# Patient Record
Sex: Male | Born: 1970
Health system: Southern US, Community
[De-identification: ages and names within clinical notes are randomized; demographics above are authoritative.]

## PROBLEM LIST (undated history)

## (undated) DIAGNOSIS — I10 Essential (primary) hypertension: Secondary | ICD-10-CM

## (undated) HISTORY — PX: EYE SURGERY: SHX253

---

## 2012-09-13 ENCOUNTER — Encounter (HOSPITAL_BASED_OUTPATIENT_CLINIC_OR_DEPARTMENT_OTHER): Payer: Self-pay

## 2012-09-13 ENCOUNTER — Emergency Department (HOSPITAL_BASED_OUTPATIENT_CLINIC_OR_DEPARTMENT_OTHER): Payer: Self-pay

## 2012-09-13 ENCOUNTER — Emergency Department (HOSPITAL_BASED_OUTPATIENT_CLINIC_OR_DEPARTMENT_OTHER)
Admission: EM | Admit: 2012-09-13 | Discharge: 2012-09-14 | Disposition: A | Discharge status: Home / Self Care | Payer: Self-pay | Attending: Emergency Medicine | Admitting: Emergency Medicine

## 2012-09-13 DIAGNOSIS — R0789 Other chest pain: Secondary | ICD-10-CM

## 2012-09-13 DIAGNOSIS — F17200 Nicotine dependence, unspecified, uncomplicated: Secondary | ICD-10-CM | POA: Insufficient documentation

## 2012-09-13 DIAGNOSIS — Z79899 Other long term (current) drug therapy: Secondary | ICD-10-CM | POA: Insufficient documentation

## 2012-09-13 DIAGNOSIS — R071 Chest pain on breathing: Secondary | ICD-10-CM | POA: Insufficient documentation

## 2012-09-13 DIAGNOSIS — N289 Disorder of kidney and ureter, unspecified: Secondary | ICD-10-CM | POA: Insufficient documentation

## 2012-09-13 DIAGNOSIS — I10 Essential (primary) hypertension: Secondary | ICD-10-CM | POA: Insufficient documentation

## 2012-09-13 LAB — BASIC METABOLIC PANEL
Calcium: 9.1 mg/dL (ref 8.4–10.5)
Creatinine, Ser: 1.4 mg/dL — ABNORMAL HIGH (ref 0.50–1.35)
GFR calc Af Amer: 71 mL/min — ABNORMAL LOW (ref >90)

## 2012-09-13 LAB — TROPONIN I: Troponin I: <0.3 ng/mL (ref <0.30)

## 2012-09-13 MED ORDER — LISINOPRIL 10 MG PO TABS
20.0000 mg | ORAL_TABLET | Freq: Once | ORAL | Status: AC
  Administered 2012-09-13: 20 mg via ORAL
  Filled 2012-09-13: qty 2

## 2012-09-13 MED ORDER — LISINOPRIL 10 MG PO TABS: 10.0000 mg | ORAL_TABLET | Freq: Once | ORAL | Status: DC

## 2012-09-13 MED ORDER — HYDROCHLOROTHIAZIDE 25 MG PO TABS: 12.5000 mg | ORAL_TABLET | Freq: Every day | ORAL | Status: DC

## 2012-09-13 MED ORDER — OXYCODONE-ACETAMINOPHEN 5-325 MG PO TABS
2.0000 | ORAL_TABLET | Freq: Once | ORAL | Status: AC
  Administered 2012-09-13: 2 via ORAL
  Filled 2012-09-13 (×2): qty 2

## 2012-09-13 MED ORDER — LISINOPRIL 20 MG PO TABS: 20.0000 mg | ORAL_TABLET | Freq: Every day | ORAL | Status: DC

## 2012-09-13 MED ORDER — HYDROCHLOROTHIAZIDE 25 MG PO TABS: 25.0000 mg | ORAL_TABLET | Freq: Every day | ORAL | Status: DC

## 2012-09-13 MED ORDER — HYDROCHLOROTHIAZIDE 25 MG PO TABS
ORAL_TABLET | ORAL | Status: AC
  Administered 2012-09-13: 25 mg
  Filled 2012-09-13: qty 1

## 2012-09-13 NOTE — ED Provider Notes (Signed)
History    This chart was scribed for Jaime Shi, MD by Leone Payor, ED Scribe. This patient was seen in room MH03/MH03 and the patient's care was started 9:01 PM.   CSN: [id number]  Arrival date & time 09/13/12  2036   First MD Initiated Contact with Patient 09/13/12 2100      Chief Complaint  Patient presents with  . Chest Pain     The history is provided by the patient. No language interpreter was used.    Jaime Brown is a 42 y.o. male who presents to the Emergency Department complaining of new, constant, unchanged left peristernal chest pain onset 3 days ago. Pt reports pain worse with inspiration and movement. He denies any recent injury or lifting anything heavy. Has family h/o of HTN but does not regularly have his BP checked. He denies SOB, nausea, vomiting.     Pt is a current everyday smoker and occasional alcohol user.  History reviewed. No pertinent past medical history.  History reviewed. No pertinent past surgical history.  No family history on file.  History  Substance Use Topics  . Smoking status: Current Every Day Smoker -- 0.50 packs/day    Types: Cigarettes  . Smokeless tobacco: Not on file  . Alcohol Use: Yes     Comment: social      Review of Systems A complete 10 system review of systems was obtained and all systems are negative except as noted in the HPI and PMH.    Allergies  Review of patient's allergies indicates no known allergies.  Home Medications   Current Outpatient Rx  Name  Route  Sig  Dispense  Refill  . hydrochlorothiazide (HYDRODIURIL) 25 MG tablet   Oral   Take 0.5 tablets (12.5 mg total) by mouth daily.   30 tablet   0   . lisinopril (PRINIVIL,ZESTRIL) 20 MG tablet   Oral   Take 1 tablet (20 mg total) by mouth daily.   30 tablet   0     BP 168/96  Pulse 66  Temp(Src) 97.9 F (36.6 C) (Oral)  Resp 16  Ht 5\' 10"  (1.778 m)  Wt 150 lb (68.04 kg)  BMI 21.52 kg/m2  SpO2 100%  Physical Exam  Nursing note  and vitals reviewed. Constitutional: He is oriented to person, place, and time. He appears well-developed and well-nourished. No distress.  HENT:  Head: Normocephalic and atraumatic.  Eyes: Pupils are equal, round, and reactive to light.  Neck: Normal range of motion.  Cardiovascular: Normal rate and intact distal pulses.   Pulmonary/Chest: No respiratory distress.  Abdominal: Normal appearance. He exhibits no distension. There is tenderness (Reproducible chest pain is noted).    Musculoskeletal: Normal range of motion.  Neurological: He is alert and oriented to person, place, and time. No cranial nerve deficit.  Skin: Skin is warm and dry. No rash noted.  Psychiatric: He has a normal mood and affect. His behavior is normal.    ED Course  Procedures (including critical care time)  Date: 09/13/2012  Rate: 65  Rhythm: normal sinus rhythm  QRS Axis: normal  Intervals: normal  ST/T Wave abnormalities: Findings consistent with left ventricular hypertrophy  Conduction Disutrbances: Early repolarization  Narrative Interpretation: Abnormal EKG     DIAGNOSTIC STUDIES: Oxygen Saturation is 100% on room air, normal by my interpretation.    COORDINATION OF CARE:  9:05 PM Discussed treatment plan which includes CXR, basic metabolic panel, troponin I with pt at bedside and pt  agreed to plan.    Results for orders placed during the hospital encounter of 09/13/12  BASIC METABOLIC PANEL      Result Value Range   Sodium 138  135 - 145 mEq/L   Potassium 3.8  3.5 - 5.1 mEq/L   Chloride 103  96 - 112 mEq/L   CO2 27  19 - 32 mEq/L   Glucose, Bld 82  70 - 99 mg/dL   BUN 13  6 - 23 mg/dL   Creatinine, Ser 6.21 (*) 0.50 - 1.35 mg/dL   Calcium 9.1  8.4 - 30.8 mg/dL   GFR calc non Af Amer 62 (*) >90 mL/min   GFR calc Af Amer 71 (*) >90 mL/min  TROPONIN I      Result Value Range   Troponin I <0.30  <0.30 ng/mL   Dg Chest 2 View  09/13/2012  *RADIOLOGY REPORT*  Clinical Data: Left chest  pain  CHEST - 2 VIEW  Comparison: None.  Findings: Lungs are clear. No pleural effusion or pneumothorax.  Cardiomediastinal silhouette is within normal limits.  Visualized osseous structures are within normal limits.  IMPRESSION: Normal chest radiographs.   Original Report Authenticated By: Charline Bills, M.D.       Labs Reviewed  BASIC METABOLIC PANEL - Abnormal; Notable for the following:    Creatinine, Ser 1.40 (*)    GFR calc non Af Amer 62 (*)    GFR calc Af Amer 71 (*)    All other components within normal limits  TROPONIN I    1. Chest wall pain   2. Hypertension   3. Renal insufficiency       MDM  I personally performed the services described in this documentation, which was scribed in my presence. The recorded information has been reviewed and considered.    Jaime Shi, MD 09/13/12 2245

## 2012-09-13 NOTE — ED Notes (Signed)
Patient here with persistent chestpain x 3 days. Reports worse with inspiration and movement. Denies any associated symptoms

## 2017-06-01 ENCOUNTER — Emergency Department (HOSPITAL_BASED_OUTPATIENT_CLINIC_OR_DEPARTMENT_OTHER): Payer: Self-pay

## 2017-06-01 ENCOUNTER — Emergency Department (HOSPITAL_BASED_OUTPATIENT_CLINIC_OR_DEPARTMENT_OTHER)
Admission: EM | Admit: 2017-06-01 | Discharge: 2017-06-01 | Disposition: A | Discharge status: Home / Self Care | Payer: Self-pay | Attending: Emergency Medicine | Admitting: Emergency Medicine

## 2017-06-01 ENCOUNTER — Encounter (HOSPITAL_BASED_OUTPATIENT_CLINIC_OR_DEPARTMENT_OTHER): Payer: Self-pay | Admitting: *Deleted

## 2017-06-01 DIAGNOSIS — Z79899 Other long term (current) drug therapy: Secondary | ICD-10-CM | POA: Insufficient documentation

## 2017-06-01 DIAGNOSIS — F17210 Nicotine dependence, cigarettes, uncomplicated: Secondary | ICD-10-CM | POA: Insufficient documentation

## 2017-06-01 DIAGNOSIS — R509 Fever, unspecified: Secondary | ICD-10-CM

## 2017-06-01 DIAGNOSIS — Z720 Tobacco use: Secondary | ICD-10-CM

## 2017-06-01 DIAGNOSIS — J209 Acute bronchitis, unspecified: Secondary | ICD-10-CM | POA: Insufficient documentation

## 2017-06-01 DIAGNOSIS — I10 Essential (primary) hypertension: Secondary | ICD-10-CM | POA: Insufficient documentation

## 2017-06-01 HISTORY — DX: Essential (primary) hypertension: I10

## 2017-06-01 MED ORDER — PREDNISONE 10 MG (21) PO TBPK
ORAL_TABLET | ORAL | 0 refills | Status: DC
Start: 2017-06-01 — End: 2018-05-22

## 2017-06-01 MED ORDER — IBUPROFEN 600 MG PO TABS: 600.0000 mg | ORAL_TABLET | Freq: Four times a day (QID) | ORAL | 0 refills | Status: DC | PRN

## 2017-06-01 MED ORDER — ALBUTEROL SULFATE HFA 108 (90 BASE) MCG/ACT IN AERS
1.0000 | INHALATION_SPRAY | RESPIRATORY_TRACT | Status: DC | PRN
  Administered 2017-06-01: 2 via RESPIRATORY_TRACT
  Filled 2017-06-01: qty 6.7

## 2017-06-01 MED ORDER — IPRATROPIUM-ALBUTEROL 0.5-2.5 (3) MG/3ML IN SOLN
3.0000 mL | Freq: Four times a day (QID) | RESPIRATORY_TRACT | Status: DC
  Administered 2017-06-01: 3 mL via RESPIRATORY_TRACT
  Filled 2017-06-01: qty 3

## 2017-06-01 MED ORDER — AZITHROMYCIN 250 MG PO TABS
500.0000 mg | ORAL_TABLET | Freq: Once | ORAL | Status: AC
Start: 2017-06-01 — End: 2017-06-01
  Administered 2017-06-01: 500 mg via ORAL
  Filled 2017-06-01: qty 2

## 2017-06-01 MED ORDER — KETOROLAC TROMETHAMINE 30 MG/ML IJ SOLN
30.0000 mg | Freq: Once | INTRAMUSCULAR | Status: AC
  Administered 2017-06-01: 30 mg via INTRAMUSCULAR
  Filled 2017-06-01: qty 1

## 2017-06-01 MED ORDER — AZITHROMYCIN 250 MG PO TABS: 250.0000 mg | ORAL_TABLET | Freq: Every day | ORAL | 0 refills | Status: DC

## 2017-06-01 MED ORDER — ACETAMINOPHEN 500 MG PO TABS
1000.0000 mg | ORAL_TABLET | Freq: Once | ORAL | Status: AC
  Administered 2017-06-01: 1000 mg via ORAL
  Filled 2017-06-01: qty 2

## 2017-06-01 MED ORDER — DEXAMETHASONE SODIUM PHOSPHATE 10 MG/ML IJ SOLN
10.0000 mg | Freq: Once | INTRAMUSCULAR | Status: AC
  Administered 2017-06-01: 10 mg via INTRAMUSCULAR
  Filled 2017-06-01: qty 1

## 2017-06-01 MED ORDER — AEROCHAMBER PLUS FLO-VU MEDIUM MISC
1.0000 | Freq: Once | Status: AC
  Administered 2017-06-01: 1
  Filled 2017-06-01: qty 1

## 2017-06-01 NOTE — ED Notes (Signed)
Alert, NAD, calm, interactive, resps e/u, speaking in clear short phrases, no dyspnea noted, skin W&D, VSS, EDP into room.

## 2017-06-01 NOTE — ED Provider Notes (Signed)
[facility] EMERGENCY DEPARTMENT Provider Note   CSN: [id number] Arrival date & time: 06/01/17  1914     History   Chief Complaint Chief Complaint  Patient presents with  . Cough    HPI Jaime Brown is a 46 y.o. male.  Pt presents to the ED today with cough for the past 1.5 weeks.  He said that he has been taking nyquil for sx.  The pt does smoke.  The pt denies any sick contacts.  No associated sx.        Past Medical History:  Diagnosis Date  . Hypertension     There are no active problems to display for this patient.   History reviewed. No pertinent surgical history.     Home Medications    Prior to Admission medications   Medication Sig Start Date End Date Taking? Authorizing Provider  azithromycin (ZITHROMAX) 250 MG tablet Take 1 tablet (250 mg total) by mouth daily. Take first 2 tablets together, then 1 every day until finished. 06/02/17   Jacalyn Lefevre, MD  hydrochlorothiazide (HYDRODIURIL) 25 MG tablet Take 0.5 tablets (12.5 mg total) by mouth daily. 09/14/12   Nelva Nay, MD  ibuprofen (ADVIL,MOTRIN) 600 MG tablet Take 1 tablet (600 mg total) by mouth every 6 (six) hours as needed. 06/01/17   Jacalyn Lefevre, MD  lisinopril (PRINIVIL,ZESTRIL) 20 MG tablet Take 1 tablet (20 mg total) by mouth daily. 09/13/12   Nelva Nay, MD  predniSONE (STERAPRED UNI-PAK 21 TAB) 10 MG (21) TBPK tablet Take 6 tabs by mouth daily  for 2 days, then 5 tabs for 2 days, then 4 tabs for 2 days, then 3 tabs for 2 days, 2 tabs for 2 days, then 1 tab by mouth daily for 2 days 06/01/17   Jacalyn Lefevre, MD    Family History History reviewed. No pertinent family history.  Social History Social History  Substance Use Topics  . Smoking status: Current Every Day Smoker    Packs/day: 0.50    Types: Cigarettes  . Smokeless tobacco: Never Used  . Alcohol use Yes     Comment: social     Allergies   Patient has no known allergies.   Review of  Systems Review of Systems  Constitutional: Positive for fever.  Respiratory: Positive for cough.   All other systems reviewed and are negative.    Physical Exam Updated Vital Signs BP (!) 157/99 (BP Location: Right Arm)   Pulse (!) 109   Temp (!) 100.6 F (38.1 C) (Oral)   Resp 20   Ht 5\' 10"  (1.778 m)   Wt 70.3 kg (155 lb)   SpO2 100%   BMI 22.24 kg/m   Physical Exam  Constitutional: He is oriented to person, place, and time. He appears well-developed and well-nourished.  HENT:  Head: Normocephalic and atraumatic.  Right Ear: External ear normal.  Left Ear: External ear normal.  Nose: Nose normal.  Mouth/Throat: Oropharynx is clear and moist.  Eyes: Pupils are equal, round, and reactive to light. Conjunctivae and EOM are normal.  Neck: Normal range of motion. Neck supple.  Cardiovascular: Regular rhythm, normal heart sounds and intact distal pulses.  Tachycardia present.   Pulmonary/Chest: Effort normal. He has wheezes.  Abdominal: Soft. Bowel sounds are normal.  Musculoskeletal: Normal range of motion.  Neurological: He is alert and oriented to person, place, and time.  Skin: Skin is warm. Capillary refill takes less than 2 seconds.  Psychiatric: He has a normal mood and affect.  His behavior is normal. Judgment and thought content normal.  Nursing note and vitals reviewed.    ED Treatments / Results  Labs (all labs ordered are listed, but only abnormal results are displayed) Labs Reviewed - No data to display  EKG  EKG Interpretation None       Radiology Dg Chest 2 View  Result Date: 06/01/2017 CLINICAL DATA:  Here for cough, (fever noted on arrival), onset 1.5 weeks ago, also reports weak, back AND ribs sore, HA, and bilateral lower legs ache. Rates pain 7/10. Cough is productive, yellow/green mucus. Smoker and welder. EXAM: CHEST  2 VIEW COMPARISON:  11/06/2015 FINDINGS: The heart size and mediastinal contours are within normal limits. Both lungs are  clear. The visualized skeletal structures are unremarkable. IMPRESSION: No active cardiopulmonary disease. Electronically Signed   By: Norva PavlovElizabeth  Brown M.D.   On: 06/01/2017 07:43    Procedures Procedures (including critical care time)  Medications Ordered in ED Medications  ipratropium-albuterol (DUONEB) 0.5-2.5 (3) MG/3ML nebulizer solution 3 mL (3 mLs Nebulization Given 06/01/17 0723)  albuterol (PROVENTIL HFA;VENTOLIN HFA) 108 (90 Base) MCG/ACT inhaler 1-2 puff (2 puffs Inhalation Given 06/01/17 0723)  azithromycin (ZITHROMAX) tablet 500 mg (not administered)  dexamethasone (DECADRON) injection 10 mg (10 mg Intramuscular Given 06/01/17 0736)  ketorolac (TORADOL) 30 MG/ML injection 30 mg (30 mg Intramuscular Given 06/01/17 0735)  acetaminophen (TYLENOL) tablet 1,000 mg (1,000 mg Oral Given 06/01/17 0735)  AEROCHAMBER PLUS FLO-VU MEDIUM MISC 1 each (1 each Other Given 06/01/17 0724)     Initial Impression / Assessment and Plan / ED Course  I have reviewed the triage vital signs and the nursing notes.  Pertinent labs & imaging results that were available during my care of the patient were reviewed by me and considered in my medical decision making (see chart for details).     Sx have been going on for 1.5 weeks, so I will treat him for bronchitis with zithromax.  He was given an inhaler and spacer prior to d/c.  Pt encouraged to stop smoking.  The pt told to return if worse.  Final Clinical Impressions(s) / ED Diagnoses   Final diagnoses:  Acute bronchitis, unspecified organism  Tobacco abuse  Fever, unspecified fever cause    New Prescriptions New Prescriptions   AZITHROMYCIN (ZITHROMAX) 250 MG TABLET    Take 1 tablet (250 mg total) by mouth daily. Take first 2 tablets together, then 1 every day until finished.   IBUPROFEN (ADVIL,MOTRIN) 600 MG TABLET    Take 1 tablet (600 mg total) by mouth every 6 (six) hours as needed.   PREDNISONE (STERAPRED UNI-PAK 21 TAB) 10 MG (21)  TBPK TABLET    Take 6 tabs by mouth daily  for 2 days, then 5 tabs for 2 days, then 4 tabs for 2 days, then 3 tabs for 2 days, 2 tabs for 2 days, then 1 tab by mouth daily for 2 days     Jacalyn LefevreHaviland, Chontel Warning, MD 06/01/17 [phone]

## 2017-06-01 NOTE — Discharge Instructions (Signed)
Try to stop smoking. °

## 2017-06-01 NOTE — ED Notes (Signed)
[facility]

## 2017-06-01 NOTE — ED Triage Notes (Addendum)
Here for cough, (fever noted on arrival), onset 1.5 weeks ago, also reports weak, back & ribs sore, HA, and bilateral lower legs ache. Rates pain 7/10. Cough is productive, yellow/green mucus. Smoker and welder. (Denies: fever, bleeding, facial pain, earache, sore throat, dizziness, or NVD). No meds PTA. Out of his BP meds.

## 2017-07-22 ENCOUNTER — Emergency Department (HOSPITAL_BASED_OUTPATIENT_CLINIC_OR_DEPARTMENT_OTHER)
Admission: EM | Admit: 2017-07-22 | Discharge: 2017-07-22 | Disposition: A | Discharge status: Home / Self Care | Payer: Self-pay | Attending: Emergency Medicine | Admitting: Emergency Medicine

## 2017-07-22 ENCOUNTER — Encounter (HOSPITAL_BASED_OUTPATIENT_CLINIC_OR_DEPARTMENT_OTHER): Payer: Self-pay | Admitting: Emergency Medicine

## 2017-07-22 ENCOUNTER — Emergency Department (HOSPITAL_BASED_OUTPATIENT_CLINIC_OR_DEPARTMENT_OTHER): Payer: Self-pay

## 2017-07-22 ENCOUNTER — Other Ambulatory Visit: Payer: Self-pay

## 2017-07-22 DIAGNOSIS — Z79899 Other long term (current) drug therapy: Secondary | ICD-10-CM | POA: Insufficient documentation

## 2017-07-22 DIAGNOSIS — R030 Elevated blood-pressure reading, without diagnosis of hypertension: Secondary | ICD-10-CM

## 2017-07-22 DIAGNOSIS — R51 Headache: Secondary | ICD-10-CM | POA: Insufficient documentation

## 2017-07-22 DIAGNOSIS — R519 Headache, unspecified: Secondary | ICD-10-CM

## 2017-07-22 DIAGNOSIS — L739 Follicular disorder, unspecified: Secondary | ICD-10-CM | POA: Insufficient documentation

## 2017-07-22 DIAGNOSIS — I10 Essential (primary) hypertension: Secondary | ICD-10-CM | POA: Insufficient documentation

## 2017-07-22 DIAGNOSIS — F17210 Nicotine dependence, cigarettes, uncomplicated: Secondary | ICD-10-CM | POA: Insufficient documentation

## 2017-07-22 LAB — COMPREHENSIVE METABOLIC PANEL
ALT: 9 U/L — ABNORMAL LOW (ref 17–63)
ANION GAP: 5 (ref 5–15)
AST: 14 U/L — ABNORMAL LOW (ref 15–41)
Albumin: 3.7 g/dL (ref 3.5–5.0)
Alkaline Phosphatase: 51 U/L (ref 38–126)
BILIRUBIN TOTAL: 0.8 mg/dL (ref 0.3–1.2)
BUN: 13 mg/dL (ref 6–20)
CHLORIDE: 105 mmol/L (ref 101–111)
CO2: 28 mmol/L (ref 22–32)
Calcium: 9 mg/dL (ref 8.9–10.3)
Creatinine, Ser: 1.29 mg/dL — ABNORMAL HIGH (ref 0.61–1.24)
GFR calc Af Amer: >60 mL/min (ref >60)
GFR calc non Af Amer: >60 mL/min (ref >60)
GLUCOSE: 101 mg/dL — AB (ref 65–99)
POTASSIUM: 3.8 mmol/L (ref 3.5–5.1)
Sodium: 138 mmol/L (ref 135–145)
TOTAL PROTEIN: 6.8 g/dL (ref 6.5–8.1)

## 2017-07-22 LAB — CBC WITH DIFFERENTIAL/PLATELET
BASOS PCT: 1 %
Basophils Absolute: 0 10*3/uL (ref 0.0–0.1)
EOS ABS: 0.1 10*3/uL (ref 0.0–0.7)
EOS PCT: 2 %
HEMATOCRIT: 37 % — AB (ref 39.0–52.0)
Hemoglobin: 12.6 g/dL — ABNORMAL LOW (ref 13.0–17.0)
Lymphocytes Relative: 41 %
Lymphs Abs: 2.4 10*3/uL (ref 0.7–4.0)
MCH: 28.4 pg (ref 26.0–34.0)
MCHC: 34.1 g/dL (ref 30.0–36.0)
MCV: 83.5 fL (ref 78.0–100.0)
MONO ABS: 0.5 10*3/uL (ref 0.1–1.0)
MONOS PCT: 9 %
NEUTROS ABS: 2.8 10*3/uL (ref 1.7–7.7)
Neutrophils Relative %: 47 %
PLATELETS: 235 10*3/uL (ref 150–400)
RBC: 4.43 MIL/uL (ref 4.22–5.81)
RDW: 14.9 % (ref 11.5–15.5)
WBC: 5.9 10*3/uL (ref 4.0–10.5)

## 2017-07-22 MED ORDER — PROCHLORPERAZINE EDISYLATE 5 MG/ML IJ SOLN
10.0000 mg | Freq: Once | INTRAMUSCULAR | Status: AC
  Administered 2017-07-22: 10 mg via INTRAVENOUS
  Filled 2017-07-22: qty 2

## 2017-07-22 MED ORDER — HYDROCHLOROTHIAZIDE 12.5 MG PO TABS: 25.0000 mg | ORAL_TABLET | Freq: Every day | ORAL | 0 refills | Status: DC

## 2017-07-22 MED ORDER — DIPHENHYDRAMINE HCL 50 MG/ML IJ SOLN
25.0000 mg | Freq: Once | INTRAMUSCULAR | Status: AC
  Administered 2017-07-22: 25 mg via INTRAVENOUS
  Filled 2017-07-22: qty 1

## 2017-07-22 NOTE — ED Triage Notes (Addendum)
Patient reports "painful headaches" upon waking up in the morning for the past week.  States he takes advil for relief. Patient states he is not taking blood pressure medication due to "not having any".  States he last took medication in July 2017 for HTN.  Additionally reports abscess to right axilla.

## 2017-07-22 NOTE — ED Provider Notes (Signed)
[facility] EMERGENCY DEPARTMENT Provider Note   CSN: [id number] Arrival date & time: 07/22/17  4098     History   Chief Complaint Chief Complaint  Patient presents with  . Headache  . Abscess    HPI Jaime Brown is a 46 y.o. male.  The history is provided by the patient and medical records. No language interpreter was used.  Headache   This is a recurrent problem. The current episode started more than 2 days ago. The problem occurs every few hours. The problem has been gradually improving. The headache is associated with nothing. The pain is located in the occipital region. The quality of the pain is described as sharp and dull. The pain is moderate. The pain does not radiate. Pertinent negatives include no fever, no chest pressure, no palpitations, no syncope, no shortness of breath, no nausea and no vomiting. Treatments tried: ibuprofen. The treatment provided mild relief.    Past Medical History:  Diagnosis Date  . Hypertension     There are no active problems to display for this patient.   History reviewed. No pertinent surgical history.     Home Medications    Prior to Admission medications   Medication Sig Start Date End Date Taking? Authorizing Provider  azithromycin (ZITHROMAX) 250 MG tablet Take 1 tablet (250 mg total) by mouth daily. Take first 2 tablets together, then 1 every day until finished. 06/02/17   Jacalyn Lefevre, MD  hydrochlorothiazide (HYDRODIURIL) 25 MG tablet Take 0.5 tablets (12.5 mg total) by mouth daily. 09/14/12   Nelva Nay, MD  ibuprofen (ADVIL,MOTRIN) 600 MG tablet Take 1 tablet (600 mg total) by mouth every 6 (six) hours as needed. 06/01/17   Jacalyn Lefevre, MD  lisinopril (PRINIVIL,ZESTRIL) 20 MG tablet Take 1 tablet (20 mg total) by mouth daily. 09/13/12   Nelva Nay, MD  predniSONE (STERAPRED UNI-PAK 21 TAB) 10 MG (21) TBPK tablet Take 6 tabs by mouth daily  for 2 days, then 5 tabs for 2 days, then 4 tabs for 2 days,  then 3 tabs for 2 days, 2 tabs for 2 days, then 1 tab by mouth daily for 2 days 06/01/17   Jacalyn Lefevre, MD    Family History History reviewed. No pertinent family history.  Social History Social History   Tobacco Use  . Smoking status: Current Every Day Smoker    Packs/day: 0.50    Types: Cigarettes  . Smokeless tobacco: Never Used  Substance Use Topics  . Alcohol use: Yes    Comment: social  . Drug use: No     Allergies   Patient has no known allergies.   Review of Systems Review of Systems  Constitutional: Negative for chills, diaphoresis, fatigue and fever.  HENT: Negative for congestion.   Eyes: Negative for photophobia and visual disturbance.  Respiratory: Negative for cough, chest tightness, shortness of breath, wheezing and stridor.   Cardiovascular: Negative for palpitations and syncope.  Gastrointestinal: Negative for nausea and vomiting.  Genitourinary: Negative for flank pain.  Musculoskeletal: Negative for back pain, neck pain and neck stiffness.  Skin: Negative for rash and wound.  Neurological: Positive for headaches. Negative for tremors, seizures, speech difficulty, weakness and light-headedness.  Psychiatric/Behavioral: Negative for agitation.  All other systems reviewed and are negative.    Physical Exam Updated Vital Signs BP (!) 184/105 (BP Location: Right Arm)   Pulse 71   Temp (!) 97.5 F (36.4 C) (Oral)   Resp 16   Ht 5\' 10"  (1.778  m)   Wt 70.3 kg (155 lb)   SpO2 99%   BMI 22.24 kg/m   Physical Exam  Constitutional: He is oriented to person, place, and time. He appears well-developed and well-nourished. No distress.  HENT:  Head: Normocephalic.  Nose: Nose normal.  Mouth/Throat: Oropharynx is clear and moist. No oropharyngeal exudate.  Eyes: Conjunctivae and EOM are normal. Pupils are equal, round, and reactive to light.  Neck: Normal range of motion. Neck supple.  Cardiovascular: Normal rate and intact distal pulses.  No  murmur heard. Pulmonary/Chest: Effort normal and breath sounds normal. No stridor. No respiratory distress. He has no wheezes. He exhibits no tenderness.  Abdominal: Soft. He exhibits no distension. There is no tenderness.  Musculoskeletal: He exhibits tenderness. He exhibits no edema or deformity.       Right shoulder: He exhibits tenderness.       Arms: Lymphadenopathy:    He has no cervical adenopathy.  Neurological: He is alert and oriented to person, place, and time. No cranial nerve deficit or sensory deficit. He exhibits normal muscle tone. Coordination normal.  Skin: Skin is warm. Capillary refill takes less than 2 seconds. He is not diaphoretic. No erythema. No pallor.  Psychiatric: He has a normal mood and affect.  Nursing note and vitals reviewed.    ED Treatments / Results  Labs (all labs ordered are listed, but only abnormal results are displayed) Labs Reviewed  CBC WITH DIFFERENTIAL/PLATELET - Abnormal; Notable for the following components:      Result Value   Hemoglobin 12.6 (*)    HCT 37.0 (*)    All other components within normal limits  COMPREHENSIVE METABOLIC PANEL - Abnormal; Notable for the following components:   Glucose, Bld 101 (*)    Creatinine, Ser 1.29 (*)    AST 14 (*)    ALT 9 (*)    All other components within normal limits    EKG  EKG Interpretation None       Radiology Ct Head Wo Contrast  Result Date: 07/22/2017 CLINICAL DATA:  Occipital headache for 1 week.  Hypertension. EXAM: CT HEAD WITHOUT CONTRAST TECHNIQUE: Contiguous axial images were obtained from the base of the skull through the vertex without intravenous contrast. COMPARISON:  None. FINDINGS: Brain: The brainstem, cerebellum, cerebral peduncles, thalami, basal ganglia, basilar cisterns, and ventricular system appear within normal limits. There are several areas where the tentorium appears slightly thickened. I reviewed this appearance with Dr. Tiburcio PeaJonathan Watts, and we both concur  that this likely falls within normal variation and is not considered worrisome. No intracranial hemorrhage, mass lesion, or acute CVA. Vascular: Unremarkable. The dural venous sinuses are felt to be within normal limits. Skull: Unremarkable Sinuses/Orbits: Unremarkable Other: No supplemental non-categorized findings. IMPRESSION: 1. No significant intracranial abnormality is observed. Electronically Signed   By: Gaylyn RongWalter  Liebkemann M.D.   On: 07/22/2017 08:34    Procedures Procedures (including critical care time)  Medications Ordered in ED Medications  prochlorperazine (COMPAZINE) injection 10 mg (10 mg Intravenous Given 07/22/17 0831)  diphenhydrAMINE (BENADRYL) injection 25 mg (25 mg Intravenous Given 07/22/17 0830)     Initial Impression / Assessment and Plan / ED Course  I have reviewed the triage vital signs and the nursing notes.  Pertinent labs & imaging results that were available during my care of the patient were reviewed by me and considered in my medical decision making (see chart for details).     Linwood DibblesOrlando Chaloux is a 46 y.o. male with a  past medical history significant for hypertension currently off of medications who presents for headache and concern for skin infection.  Patient reports that he has had headaches for the last week.  He reports that being in the mornings primarily.  He denies any history intracranial abnormality.  He says that he has not been on his blood pressure medicine for over one year.  He says that he did not follow-up with his PCP and never got his blood pressure prescription refilled.  He says that for the last week he has been having headaches.  They are sharp and aching.  He describes it as a 5 out of 10 currently.  No associated nausea, vomiting, or vision changes.  He says that he has had headaches in the past with elevated blood pressure and says this feels similar.  He denies recent head trauma or neck trauma.  He reports no numbness, tingling, or  weakness of extremities.  No gait difficulties by report.  He denies any chest pain, palpitations, shortness of breath, abdominal pain, urinary symptoms, or GI symptoms.  He says that he has developed a pain in his right armpit that feels like a small bump.  It is mild and pain.  No drainage or surrounding redness.  On exam, patient's right axilla has a 2 mm palpable nodule that feels like a dense follicle.  There is some tenderness but no overlying or crepitance.  No induration.  It is nonmobile.  No other nodules or bumps were palpated.  Suspect bump is a small follicle that is irritated versus abscess.  Given the small size, patient would rather wait and see if it got better on its own for the next few days.  Patient was offered incision and drainage or aspiration but patient deferred given the mild symptoms.  Patient would rather focus on his headache.  Patient's exam otherwise was unremarkable in regards to neurologic exam.  Pupil exam unremarkable.  Lungs clear and chest nontender.  Abdomen nontender.  Due to the patient's blood pressure on arrival in the 180s and his headaches, patient will have CT of the head to look for a normality.  Given the morning headaches, malignancy is considered and will look for large malignancy on CT head.  Regardless, patient will likely to follow-up with PCP for reassessment and possible future MRI if his symptoms persist.  Patient will be given headache cocktail and have his blood pressure monitored.  We will likely give patient home blood pressure medicine.  We will get screening blood work to look for kidney or I dysfunction which may contribute to symptoms.  Anticipate reassessment after imaging lab testing and medication administration.  12:25 PM Patient reports headache is improved after medications.  Laboratory testing is overall reassuring.  Slight anemia and improved creatinine from prior.  CT head showed no significant intracranial abnormalities.   Next  Blood pressure improved while in the emergency department.  Patient will be restarted on his home blood pressure medicine and instructed to follow-up with a PCP for further management.  Patient understood return precautions for new or worsening symptoms.  Doubt intracranial hemorrhage or meningitis.  Patient had no other questions or concerns and was discharged in good condition.   Final Clinical Impressions(s) / ED Diagnoses   Final diagnoses:  Nonintractable headache, unspecified chronicity pattern, unspecified headache type  Elevated blood pressure reading  Folliculitis    ED Discharge Orders        Ordered    hydrochlorothiazide (HYDRODIURIL) 12.5 MG  tablet  Daily     07/22/17 1228      Clinical Impression: 1. Nonintractable headache, unspecified chronicity pattern, unspecified headache type   2. Elevated blood pressure reading   3. Folliculitis     Disposition: Discharge  Condition: Good  I have discussed the results, Dx and Tx plan with the pt(& family if present). He/she/they expressed understanding and agree(s) with the plan. Discharge instructions discussed at great length. Strict return precautions discussed and pt &/or family have verbalized understanding of the instructions. No further questions at time of discharge.    This SmartLink is deprecated. Use AVSMEDLIST instead to display the medication list for a patient.  [facility] Up: [facility] [address] [place] [id number] [phone] Schedule an appointment as soon as possible for a visit    [facility] [address] [id number] [address]c [address] [phone]  If symptoms worsen     Tegeler, Canary Brimhristopher J, MD 07/22/17 1743

## 2017-07-22 NOTE — Discharge Instructions (Signed)
Your workup today showed no evidence of intracranial abnormality causing her headaches.  We suspect was related to her elevated blood pressure.  Your blood pressure improved in the emergency department.  Please start taking your home blood pressure medicine again and follow-up with a primary care physician for further management.  We suspect you have a small folliculitis in your armpit, please watch this and follow-up with your PCP.  We did not feel it was large enough to require incision and drainage today but if it continues to worsen, please return.

## 2017-09-30 ENCOUNTER — Encounter (HOSPITAL_COMMUNITY): Payer: Self-pay

## 2017-09-30 ENCOUNTER — Ambulatory Visit (HOSPITAL_COMMUNITY)
Admission: EM | Admit: 2017-09-30 | Discharge: 2017-09-30 | Disposition: A | Discharge status: Home / Self Care | Payer: Self-pay | Attending: Family Medicine | Admitting: Family Medicine

## 2017-09-30 DIAGNOSIS — I10 Essential (primary) hypertension: Secondary | ICD-10-CM

## 2017-09-30 DIAGNOSIS — M549 Dorsalgia, unspecified: Secondary | ICD-10-CM

## 2017-09-30 DIAGNOSIS — R7989 Other specified abnormal findings of blood chemistry: Secondary | ICD-10-CM

## 2017-09-30 LAB — POCT URINALYSIS DIP (DEVICE)
BILIRUBIN URINE: NEGATIVE
Glucose, UA: NEGATIVE mg/dL
HGB URINE DIPSTICK: NEGATIVE
LEUKOCYTES UA: NEGATIVE
NITRITE: NEGATIVE
Protein, ur: NEGATIVE mg/dL
SPECIFIC GRAVITY, URINE: 1.025 (ref 1.005–1.030)
Urobilinogen, UA: 1 mg/dL (ref 0.0–1.0)
pH: 5.5 (ref 5.0–8.0)

## 2017-09-30 LAB — POCT I-STAT, CHEM 8
BUN: 15 mg/dL (ref 6–20)
CHLORIDE: 106 mmol/L (ref 101–111)
Calcium, Ion: 1.18 mmol/L (ref 1.15–1.40)
Creatinine, Ser: 1.5 mg/dL — ABNORMAL HIGH (ref 0.61–1.24)
GLUCOSE: 90 mg/dL (ref 65–99)
HCT: 44 % (ref 39.0–52.0)
Hemoglobin: 15 g/dL (ref 13.0–17.0)
Potassium: 4.2 mmol/L (ref 3.5–5.1)
SODIUM: 141 mmol/L (ref 135–145)
TCO2: 25 mmol/L (ref 22–32)

## 2017-09-30 MED ORDER — IBUPROFEN 800 MG PO TABS
ORAL_TABLET | ORAL | Status: AC
  Filled 2017-09-30: qty 1

## 2017-09-30 MED ORDER — ACETAMINOPHEN 500 MG PO TABS: 500.0000 mg | ORAL_TABLET | Freq: Four times a day (QID) | ORAL | 0 refills | Status: DC | PRN

## 2017-09-30 MED ORDER — AMLODIPINE BESYLATE 5 MG PO TABS: 5.0000 mg | ORAL_TABLET | Freq: Every day | ORAL | 0 refills | Status: DC

## 2017-09-30 MED ORDER — IBUPROFEN 800 MG PO TABS
800.0000 mg | ORAL_TABLET | Freq: Once | ORAL | Status: AC
  Administered 2017-09-30: 800 mg via ORAL

## 2017-09-30 NOTE — Discharge Instructions (Signed)
Your kidney function is slightly decreased today which is concerning for dehydration vs affect from constant elevated blood pressure. Please increase your water intake. Tylenol for back pain. Please pick up and start taking your blood pressure medications. Please return for BP check and recheck of your kidney function on 2/1. Please establish with a primary care provider for further management of your BP and kidney function. If you develop worsening of pain, inability to urinate, fevers, blood to urine, nausea, vomiting, headaches, dizziness or otherwise worsening please visit the Er.

## 2017-09-30 NOTE — ED Triage Notes (Signed)
Complaints of left sided pain, hurts when he breathes and said he didn't think he pulled anything. No urinary complaints but did get a UA just in case. Flank pain. No otc meds tried.

## 2017-09-30 NOTE — ED Provider Notes (Addendum)
[facility]    CSN: [id number] Arrival date & time: 09/30/17  1001     History   Chief Complaint Chief Complaint  Patient presents with  . Flank Pain    HPI Jaime Brown is a 47 y.o. male.   Jaime Brown presents with complaints of left mid back pain which started while he was driving today. Movements of the back worsens the pain including deep breathing. Pain started suddenly, no known injury, trauma, or over use. Denies any previous similar. Denies urinary symptoms, blood to urine or frequency. Rates pain 5/10. Denies cough, congestion. Without recent travel, leg pain. He does smoke approximately 0.5ppd. Denies nausea, vomiting, diarrhea. Has not taken any medications for symptoms. History of hypertension, states he has never filled his BP medications so does not take. Does not follow with a PCP.     ROS per HPI.       Past Medical History:  Diagnosis Date  . Hypertension     There are no active problems to display for this patient.   History reviewed. No pertinent surgical history.     Home Medications    Prior to Admission medications   Medication Sig Start Date End Date Taking? Authorizing Provider  acetaminophen (TYLENOL) 500 MG tablet Take 1 tablet (500 mg total) by mouth every 6 (six) hours as needed. 09/30/17   Georgetta HaberBurky, Natalie B, NP  azithromycin (ZITHROMAX) 250 MG tablet Take 1 tablet (250 mg total) by mouth daily. Take first 2 tablets together, then 1 every day until finished. 06/02/17   Jacalyn LefevreHaviland, Julie, MD  hydrochlorothiazide (HYDRODIURIL) 12.5 MG tablet Take 2 tablets (25 mg total) by mouth daily. 07/22/17   Tegeler, Canary Brimhristopher J, MD  hydrochlorothiazide (HYDRODIURIL) 25 MG tablet Take 0.5 tablets (12.5 mg total) by mouth daily. 09/14/12   Nelva NayBeaton, Robert, MD  ibuprofen (ADVIL,MOTRIN) 600 MG tablet Take 1 tablet (600 mg total) by mouth every 6 (six) hours as needed. 06/01/17   Jacalyn LefevreHaviland, Julie, MD  lisinopril (PRINIVIL,ZESTRIL) 20 MG tablet Take 1  tablet (20 mg total) by mouth daily. 09/13/12   Nelva NayBeaton, Robert, MD  predniSONE (STERAPRED UNI-PAK 21 TAB) 10 MG (21) TBPK tablet Take 6 tabs by mouth daily  for 2 days, then 5 tabs for 2 days, then 4 tabs for 2 days, then 3 tabs for 2 days, 2 tabs for 2 days, then 1 tab by mouth daily for 2 days 06/01/17   Jacalyn LefevreHaviland, Julie, MD    Family History No family history on file.  Social History Social History   Tobacco Use  . Smoking status: Current Every Day Smoker    Packs/day: 0.50    Types: Cigarettes  . Smokeless tobacco: Never Used  Substance Use Topics  . Alcohol use: Yes    Comment: social  . Drug use: No     Allergies   Patient has no known allergies.   Review of Systems Review of Systems   Physical Exam Triage Vital Signs ED Triage Vitals  Enc Vitals Group     BP 09/30/17 1037 (!) 165/111     Pulse Rate 09/30/17 1037 (!) 103     Resp 09/30/17 1037 18     Temp 09/30/17 1037 98.4 F (36.9 C)     Temp Source 09/30/17 1037 Oral     SpO2 09/30/17 1037 97 %     Weight --      Height --      Head Circumference --      Peak Flow --  Pain Score 09/30/17 1051 5     Pain Loc --      Pain Edu? --      Excl. in GC? --    No data found.  Updated Vital Signs BP (!) 165/111 (BP Location: Right Arm) Comment: Notified [place]  Pulse (!) 103   Temp 98.4 F (36.9 C) (Oral)   Resp 18   SpO2 97%   Visual Acuity Right Eye Distance:   Left Eye Distance:   Bilateral Distance:    Right Eye Near:   Left Eye Near:    Bilateral Near:     Physical Exam  Constitutional: He is oriented to person, place, and time. He appears well-developed and well-nourished.  Cardiovascular: Normal rate and regular rhythm.  Pulmonary/Chest: Effort normal and breath sounds normal. No stridor. No respiratory distress. He has no wheezes.  Musculoskeletal:       Thoracic back: He exhibits tenderness and pain. He exhibits normal range of motion, no bony tenderness, no swelling, no edema, no  deformity, no spasm and normal pulse.       Back:  Left mid back pain with palpation and with engagement of musculature with movement; full ROM to arms and legs; without skin rash presence  Neurological: He is alert and oriented to person, place, and time.  Skin: Skin is warm and dry.     UC Treatments / Results  Labs (all labs ordered are listed, but only abnormal results are displayed) Labs Reviewed  POCT URINALYSIS DIP (DEVICE) - Abnormal; Notable for the following components:      Result Value   Ketones, ur TRACE (*)    All other components within normal limits  POCT I-STAT, CHEM 8 - Abnormal; Notable for the following components:   Creatinine, Ser 1.50 (*)    All other components within normal limits    EKG  EKG Interpretation None       Radiology No results found.  Procedures Procedures (including critical care time)  Medications Ordered in UC Medications  ibuprofen (ADVIL,MOTRIN) tablet 800 mg (800 mg Oral Given 09/30/17 1111)     Initial Impression / Assessment and Plan / UC Course  I have reviewed the triage vital signs and the nursing notes.  Pertinent labs & imaging results that were available during my care of the patient were reviewed by me and considered in my medical decision making (see chart for details).     Patient drinking water during exam. Non toxic in appearance. Noted elevated BP and mild tachycardia. Pain reproducible on palpation to left back, likely musculoskeletal in nature. Without blood or leukocytes to urine. Ketones to urine, creatinine again elevated today at 1.5. Will encourage increase water intake, take BP medications as previously prescribed. Return here on Friday for BP and creatinine recheck or sooner with a PCP. Return precautions provided. Patient verbalized understanding and agreeable to plan.     Case discussed with supervising physician Dr. Tracie Harrier   Final Clinical Impressions(s) / UC Diagnoses   Final diagnoses:  Mid  back pain on left side  Elevated serum creatinine  Hypertension, unspecified type    ED Discharge Orders        Ordered    acetaminophen (TYLENOL) 500 MG tablet  Every 6 hours PRN     09/30/17 1121       Controlled Substance Prescriptions [place] Controlled Substance Registry consulted? Not Applicable   Georgetta Haber, NP 09/30/17 1124    Linus Mako B, NP 09/30/17 1125

## 2018-05-22 ENCOUNTER — Ambulatory Visit (HOSPITAL_COMMUNITY)
Admission: EM | Admit: 2018-05-22 | Discharge: 2018-05-22 | Disposition: A | Discharge status: Home / Self Care | Payer: Self-pay | Attending: Family Medicine | Admitting: Family Medicine

## 2018-05-22 ENCOUNTER — Encounter (HOSPITAL_COMMUNITY): Payer: Self-pay | Admitting: Emergency Medicine

## 2018-05-22 DIAGNOSIS — M549 Dorsalgia, unspecified: Secondary | ICD-10-CM

## 2018-05-22 DIAGNOSIS — R030 Elevated blood-pressure reading, without diagnosis of hypertension: Secondary | ICD-10-CM

## 2018-05-22 LAB — POCT URINALYSIS DIP (DEVICE)
Bilirubin Urine: NEGATIVE
Glucose, UA: NEGATIVE mg/dL
Hgb urine dipstick: NEGATIVE
KETONES UR: NEGATIVE mg/dL
Leukocytes, UA: NEGATIVE
Nitrite: NEGATIVE
PH: 6 (ref 5.0–8.0)
PROTEIN: 30 mg/dL — AB
Specific Gravity, Urine: 1.025 (ref 1.005–1.030)
Urobilinogen, UA: 0.2 mg/dL (ref 0.0–1.0)

## 2018-05-22 MED ORDER — MELOXICAM 7.5 MG PO TABS: 7.5000 mg | ORAL_TABLET | Freq: Every day | ORAL | 0 refills | Status: DC

## 2018-05-22 MED ORDER — CYCLOBENZAPRINE HCL 5 MG PO TABS: 5.0000 mg | ORAL_TABLET | Freq: Every day | ORAL | 0 refills | Status: DC

## 2018-05-22 NOTE — Discharge Instructions (Signed)
Light and regular activity as tolerated, see exercises as provided.  Flexeril at night. May cause drowsiness. Please do not take if driving or drinking alcohol.  Meloxicam once a day, take with food. Don't take additional ibuprofen/advil or aleve.  Please continue taking your daily blood pressure medication.  Please follow up with a primary care doctor for recheck of your BP as it is important to get this managed, as well as to recheck if your back pain persists.

## 2018-05-22 NOTE — ED Triage Notes (Signed)
Pt states his back hurts in the area where his kidneys are located.. Pt states he was seen here for this issue before.

## 2018-05-22 NOTE — ED Provider Notes (Signed)
[facility]    CSN: [id number] Arrival date & time: 05/22/18  1402     History   Chief Complaint Chief Complaint  Patient presents with  . Back Pain    HPI Jaime Brown is a 47 y.o. male.   Jaime Brown presents with complaints of mid to low back pain which has been ongoing for the past month. No specific known injury. Worsens with activity. He works as a Psychologist, occupational. Has been taking an OTC pain medication but doesn't know what it was, states it helped some, takes it a few times a day. Hasn't taken anything today for pain. Pain doesn't radiate. No nausea or vomiting. No fevers. No urinary symptoms. No blood in urine. No abdominal pain. Has had similar, was seen here in UC 09/2017 for similar, with creatinine at 1.5. States he has been taking his blood pressure medication, but doesn't know what it is. Doesn't follow with a PCP. States he is concerned about his kidneys as his mother died related to kidney complications. History of hypertension.     ROS per HPI.      Past Medical History:  Diagnosis Date  . Hypertension     There are no active problems to display for this patient.   History reviewed. No pertinent surgical history.     Home Medications    Prior to Admission medications   Medication Sig Start Date End Date Taking? Authorizing Provider  acetaminophen (TYLENOL) 500 MG tablet Take 1 tablet (500 mg total) by mouth every 6 (six) hours as needed. 09/30/17   Georgetta Haber, NP  amLODipine (NORVASC) 5 MG tablet Take 1 tablet (5 mg total) by mouth daily. 09/30/17   Georgetta Haber, NP  cyclobenzaprine (FLEXERIL) 5 MG tablet Take 1 tablet (5 mg total) by mouth at bedtime. 05/22/18   Georgetta Haber, NP  hydrochlorothiazide (HYDRODIURIL) 12.5 MG tablet Take 2 tablets (25 mg total) by mouth daily. 07/22/17   Tegeler, Canary Brim, MD  hydrochlorothiazide (HYDRODIURIL) 25 MG tablet Take 0.5 tablets (12.5 mg total) by mouth daily. 09/14/12   Nelva Nay, MD    lisinopril (PRINIVIL,ZESTRIL) 20 MG tablet Take 1 tablet (20 mg total) by mouth daily. 09/13/12   Nelva Nay, MD  meloxicam (MOBIC) 7.5 MG tablet Take 1 tablet (7.5 mg total) by mouth daily. 05/22/18   Georgetta Haber, NP    Family History No family history on file.  Social History Social History   Tobacco Use  . Smoking status: Current Every Day Smoker    Packs/day: 0.50    Types: Cigarettes  . Smokeless tobacco: Never Used  Substance Use Topics  . Alcohol use: Yes    Comment: social  . Drug use: No     Allergies   Patient has no known allergies.   Review of Systems Review of Systems   Physical Exam Triage Vital Signs ED Triage Vitals  Enc Vitals Group     BP 05/22/18 1453 (!) 169/107     Pulse Rate 05/22/18 1453 92     Resp 05/22/18 1453 18     Temp 05/22/18 1453 98.4 F (36.9 C)     Temp Source 05/22/18 1453 Oral     SpO2 05/22/18 1453 100 %     Weight 05/22/18 1435 155 lb (70.3 kg)     Height --      Head Circumference --      Peak Flow --      Pain Score 05/22/18 1438 4  Pain Loc --      Pain Edu? --      Excl. in GC? --    No data found.  Updated Vital Signs BP (!) 169/107 (BP Location: Left Arm)   Pulse 92   Temp 98.4 F (36.9 C) (Oral)   Resp 18   Wt 155 lb (70.3 kg)   SpO2 100%   BMI 22.24 kg/m    Physical Exam  Constitutional: He is oriented to person, place, and time. He appears well-developed and well-nourished.  Cardiovascular: Normal rate and regular rhythm.  Pulmonary/Chest: Effort normal and breath sounds normal.  Abdominal: Soft. Bowel sounds are normal.  Musculoskeletal:       Thoracic back: He exhibits tenderness, bony tenderness and pain. He exhibits normal range of motion, no swelling, no edema, no deformity, no laceration, no spasm and normal pulse.       Back:  Mid/low back pain with bony and musculature tenderness on palpation; full ROM of lower extremities as well as upper extremities; strength equal  bilaterally; gross sensation intact to upper and lower extremities; no step off or deformity of spinous processes  Neurological: He is alert and oriented to person, place, and time.  Skin: Skin is warm and dry.     UC Treatments / Results  Labs (all labs ordered are listed, but only abnormal results are displayed) Labs Reviewed  POCT URINALYSIS DIP (DEVICE) - Abnormal; Notable for the following components:      Result Value   Protein, ur 30 (*)    All other components within normal limits    EKG None  Radiology No results found.  Procedures Procedures (including critical care time)  Medications Ordered in UC Medications - No data to display  Initial Impression / Assessment and Plan / UC Course  I have reviewed the triage vital signs and the nursing notes.  Pertinent labs & imaging results that were available during my care of the patient were reviewed by me and considered in my medical decision making (see chart for details).     Urine with protein noted, otherwise no blood, leukocytes, ketones. bp elevated today, concern that patient is not taking any bp medication. Discussed the risks of unmanaged htn. Physical exam consistent with musculoskeletal pain. 10 days of meloxicam, discussed limitation of this due to slight elevation in kidney function and htn. Flexeril at night. Exercises provided. Encouraged follow up with PCP for recheck of symptoms and BP. Patient verbalized understanding and agreeable to plan.  Ambulatory out of clinic without difficulty.    Final Clinical Impressions(s) / UC Diagnoses   Final diagnoses:  Mid back pain     Discharge Instructions     Light and regular activity as tolerated, see exercises as provided.  Flexeril at night. May cause drowsiness. Please do not take if driving or drinking alcohol.  Meloxicam once a day, take with food. Don't take additional ibuprofen/advil or aleve.  Please continue taking your daily blood pressure  medication.  Please follow up with a primary care doctor for recheck of your BP as it is important to get this managed, as well as to recheck if your back pain persists.    ED Prescriptions    Medication Sig Dispense Auth. Provider   meloxicam (MOBIC) 7.5 MG tablet Take 1 tablet (7.5 mg total) by mouth daily. 10 tablet Linus Mako B, NP   cyclobenzaprine (FLEXERIL) 5 MG tablet Take 1 tablet (5 mg total) by mouth at bedtime. 15 tablet Linus Mako  B, NP     Controlled Substance Prescriptions [place] Controlled Substance Registry consulted? Not Applicable   Georgetta Haber, NP 05/22/18 1519

## 2018-06-02 ENCOUNTER — Emergency Department (HOSPITAL_COMMUNITY): Payer: Worker's Compensation

## 2018-06-02 ENCOUNTER — Other Ambulatory Visit: Payer: Self-pay

## 2018-06-02 ENCOUNTER — Emergency Department (HOSPITAL_COMMUNITY)
Admission: EM | Admit: 2018-06-02 | Discharge: 2018-06-02 | Disposition: A | Discharge status: Home / Self Care | Payer: Worker's Compensation | Attending: Emergency Medicine | Admitting: Emergency Medicine

## 2018-06-02 ENCOUNTER — Encounter (HOSPITAL_COMMUNITY): Payer: Self-pay | Admitting: *Deleted

## 2018-06-02 DIAGNOSIS — M79671 Pain in right foot: Secondary | ICD-10-CM | POA: Diagnosis present

## 2018-06-02 DIAGNOSIS — M25571 Pain in right ankle and joints of right foot: Secondary | ICD-10-CM

## 2018-06-02 DIAGNOSIS — I10 Essential (primary) hypertension: Secondary | ICD-10-CM

## 2018-06-02 DIAGNOSIS — Z79899 Other long term (current) drug therapy: Secondary | ICD-10-CM | POA: Insufficient documentation

## 2018-06-02 DIAGNOSIS — F17210 Nicotine dependence, cigarettes, uncomplicated: Secondary | ICD-10-CM | POA: Diagnosis not present

## 2018-06-02 LAB — I-STAT CHEM 8, ED
BUN: 13 mg/dL (ref 6–20)
Calcium, Ion: 1.2 mmol/L (ref 1.15–1.40)
Chloride: 106 mmol/L (ref 98–111)
Creatinine, Ser: 1.5 mg/dL — ABNORMAL HIGH (ref 0.61–1.24)
Glucose, Bld: 91 mg/dL (ref 70–99)
HCT: 38 % — ABNORMAL LOW (ref 39.0–52.0)
Hemoglobin: 12.9 g/dL — ABNORMAL LOW (ref 13.0–17.0)
Potassium: 4.1 mmol/L (ref 3.5–5.1)
Sodium: 142 mmol/L (ref 135–145)
TCO2: 26 mmol/L (ref 22–32)

## 2018-06-02 MED ORDER — DICLOFENAC SODIUM 75 MG PO TBEC: 75.0000 mg | DELAYED_RELEASE_TABLET | Freq: Two times a day (BID) | ORAL | 0 refills | Status: DC | PRN

## 2018-06-02 MED ORDER — HYDROCHLOROTHIAZIDE 12.5 MG PO TABS: 12.5000 mg | ORAL_TABLET | Freq: Every day | ORAL | 0 refills | Status: DC

## 2018-06-02 MED ORDER — ACETAMINOPHEN 500 MG PO TABS: 500.0000 mg | ORAL_TABLET | Freq: Four times a day (QID) | ORAL | 0 refills | Status: DC | PRN

## 2018-06-02 NOTE — ED Triage Notes (Signed)
Pt in stating he had a sawhorse fall on his right foot at work, increased pain with ambulation

## 2018-06-02 NOTE — Discharge Instructions (Addendum)
1. Medications: Take Voltaren twice daily and [phone] mg of Tylenol every 6 hours as needed for pain. Do not exceed 4000 mg of Tylenol daily.  Take Voltaren with food to avoid upset stomach issues.  2. Treatment: rest, ice, elevate and use brace, drink plenty of fluids, gentle stretching.  Use the crutches to help you get around. 3. Follow Up: Please followup with orthopedics as directed or your PCP in 1 week if no improvement for discussion of your diagnoses and further evaluation after today's visit; if you do not have a primary care doctor use the resource guide provided to find one; Please return to the ER for worsening symptoms or other concerns such as worsening swelling, redness of the skin, fevers, loss of pulses, or loss of feeling  Start start taking HCTZ for your blood pressure.  This medication will cause you to urinate more and may cause you to feel somewhat lightheaded.  Follow-up with a primary care physician for reevaluation of your blood pressure.  I have given you information for [facility] which is specifically for people that do not have health insurance.  Call them and tell them you were referred from the emergency department and they will set you up with an appointment.   If your blood pressure (BP) was elevated on multiple readings during this visit above 130 for the top number or above 80 for the bottom number, please have this repeated by your primary care provider within one month. You can also check your blood pressure when you are out at a pharmacy or grocery store. Many have machines that will check your blood pressure.  If your blood pressure remains elevated, please follow-up with your PCP.

## 2018-06-02 NOTE — ED Provider Notes (Signed)
[facility] EMERGENCY DEPARTMENT Provider Note   CSN: [id number] Arrival date & time: 06/02/18  1223     History   Chief Complaint Chief Complaint  Patient presents with  . Foot Pain    HPI Jaime Brown is a 47 y.o. male with history of hypertension presents for evaluation of acute onset, constant right ankle pain secondary to injury earlier today.  He states that a 50 to 60 pound sawhorse was accidentally dropped on his right foot while at work this morning.  He notes pain to the lateral aspect of the right ankle radiating to the lateral aspect of the right foot.  Pain is approximately 6/10 in severity.  He notes numbness and tingling initially which has since resolved.  He does have a superficial abrasion to the lateral aspect of the left distal lower leg.  States tetanus is up-to-date.  He has a history of hypertension but does not take any medications for it.  Does not currently have a PCP.  The history is provided by the patient.    Past Medical History:  Diagnosis Date  . Hypertension     There are no active problems to display for this patient.   History reviewed. No pertinent surgical history.      Home Medications    Prior to Admission medications   Medication Sig Start Date End Date Taking? Authorizing Provider  acetaminophen (TYLENOL) 500 MG tablet Take 1 tablet (500 mg total) by mouth every 6 (six) hours as needed. 06/02/18   Janene Yousuf A, PA-C  amLODipine (NORVASC) 5 MG tablet Take 1 tablet (5 mg total) by mouth daily. 09/30/17   Georgetta Haber, NP  cyclobenzaprine (FLEXERIL) 5 MG tablet Take 1 tablet (5 mg total) by mouth at bedtime. 05/22/18   Georgetta Haber, NP  diclofenac (VOLTAREN) 75 MG EC tablet Take 1 tablet (75 mg total) by mouth 2 (two) times daily as needed for moderate pain. 06/02/18   Willam Munford A, PA-C  hydrochlorothiazide (HYDRODIURIL) 12.5 MG tablet Take 1 tablet (12.5 mg total) by mouth daily. 06/02/18   Jove Beyl  A, PA-C  lisinopril (PRINIVIL,ZESTRIL) 20 MG tablet Take 1 tablet (20 mg total) by mouth daily. 09/13/12   Nelva Nay, MD  meloxicam (MOBIC) 7.5 MG tablet Take 1 tablet (7.5 mg total) by mouth daily. 05/22/18   Georgetta Haber, NP    Family History History reviewed. No pertinent family history.  Social History Social History   Tobacco Use  . Smoking status: Current Every Day Smoker    Packs/day: 0.50    Types: Cigarettes  . Smokeless tobacco: Never Used  Substance Use Topics  . Alcohol use: Yes    Comment: social  . Drug use: No     Allergies   Patient has no known allergies.   Review of Systems Review of Systems  Constitutional: Negative for fever.  Musculoskeletal: Positive for arthralgias.  Skin: Positive for wound.  Neurological: Negative for weakness and numbness.     Physical Exam Updated Vital Signs BP (!) 180/110 (BP Location: Right Arm)   Pulse 64   Temp 98.3 F (36.8 C) (Oral)   Resp 18   SpO2 100%   Physical Exam  Constitutional: He appears well-developed and well-nourished. No distress.  HENT:  Head: Normocephalic and atraumatic.  Eyes: Conjunctivae are normal. Right eye exhibits no discharge. Left eye exhibits no discharge.  Neck: No JVD present. No tracheal deviation present.  Cardiovascular: Normal rate and intact  distal pulses.  2+ DP/PT pulses bilaterally, no lower extremity edema, Homans sign absent bilaterally  Pulmonary/Chest: Effort normal.  Abdominal: He exhibits no distension.  Musculoskeletal: He exhibits no edema.  Superficial abrasion to the lateral aspect of the left lower leg and ankle.  There is tenderness to palpation overlying the left lateral malleolus and Achilles tendon with no evidence of Achilles tendon disruption.  No ligamentous laxity or varus or valgus instability noted.  No crepitus, warmth, or ecchymosis.  5/5 strength of BLE major muscle groups.  Neurological: He is alert. No sensory deficit. He exhibits normal  muscle tone.  Fluent speech, no facial droop, sensation intact to soft touch of bilateral lower extremities.  Ambulates with a mildly antalgic gait but exhibits good balance and is able to Heel Walk and Toe Walk without difficulty.  Skin: Skin is warm and dry. No erythema.  Psychiatric: He has a normal mood and affect. His behavior is normal.  Nursing note and vitals reviewed.    ED Treatments / Results  Labs (all labs ordered are listed, but only abnormal results are displayed) Labs Reviewed  I-STAT CHEM 8, ED - Abnormal; Notable for the following components:      Result Value   Creatinine, Ser 1.50 (*)    Hemoglobin 12.9 (*)    HCT 38.0 (*)    All other components within normal limits    EKG None  Radiology Dg Ankle Complete Right  Result Date: 06/02/2018 CLINICAL DATA:  Blunt trauma to the lateral right ankle with pain and swelling, initial encounter EXAM: RIGHT ANKLE - COMPLETE 3+ VIEW COMPARISON:  None. FINDINGS: There is no evidence of fracture, dislocation, or joint effusion. There is no evidence of arthropathy or other focal bone abnormality. Soft tissues are unremarkable. IMPRESSION: No acute abnormality noted. Electronically Signed   By: Alcide Clever M.D.   On: 06/02/2018 13:26   Dg Foot Complete Right  Result Date: 06/02/2018 CLINICAL DATA:  Blunt trauma to foot and ankle with pain laterally, initial encounter EXAM: RIGHT FOOT COMPLETE - 3+ VIEW COMPARISON:  None. FINDINGS: There is no evidence of fracture or dislocation. There is no evidence of arthropathy or other focal bone abnormality. Soft tissues are unremarkable. IMPRESSION: No acute abnormality noted. Electronically Signed   By: Alcide Clever M.D.   On: 06/02/2018 13:25    Procedures Procedures (including critical care time)  Medications Ordered in ED Medications - No data to display   Initial Impression / Assessment and Plan / ED Course  I have reviewed the triage vital signs and the nursing  notes.  Pertinent labs & imaging results that were available during my care of the patient were reviewed by me and considered in my medical decision making (see chart for details).     Patient with right foot pain secondary to injury at work earlier today.  He is neurovascularly intact and ambulatory without difficulty. Patient X-Ray negative for obvious fracture or dislocation. Pt advised to follow up with orthopedics if symptoms persist for possibility of missed fracture diagnosis. Patient given and crutches brace while in ED, conservative therapy recommended and discussed.  No evidence of secondary skin infection, DVT, or septic joint.  Tetanus up-to-date.  He is hypertensive in the ED today.  Chart review shows that this is his baseline.  He does not currently take any medicines for this and does not have PCP follow-up.  We will check lab work to rule out acute kidney injury or electrolyte abnormalities.  He  is willing to try a blood pressure medication.  Advised that he follow-up with a PCP for reevaluation and further management.  Lab work shows creatinine of 1.50, similar to last creatinine obtained.  No electrolyte abnormalities.  Will discharge with HCTZ.  No evidence of endorgan involvement suggestive of hypertensive emergency.  Recommend follow-up with [facility] for reevaluation.  Discussed strict ED return precautions. Pt verbalized understanding of and agreement with plan and is safe for discharge home at this time.   Final Clinical Impressions(s) / ED Diagnoses   Final diagnoses:  Right foot pain  Acute right ankle pain  Hypertension, unspecified type    ED Discharge Orders         Ordered    acetaminophen (TYLENOL) 500 MG tablet  Every 6 hours PRN     06/02/18 1522    diclofenac (VOLTAREN) 75 MG EC tablet  2 times daily PRN     06/02/18 1522    hydrochlorothiazide (HYDRODIURIL) 12.5 MG tablet  Daily     06/02/18 1522           Jeanie Sewer,  PA-C 06/02/18 1524    Pricilla Loveless, MD 06/02/18 1556

## 2018-06-02 NOTE — ED Notes (Signed)
Pt stable and ambulatory for discharge, states understanding follow up for blood pressure

## 2018-08-20 ENCOUNTER — Emergency Department (HOSPITAL_COMMUNITY)
Admission: EM | Admit: 2018-08-20 | Discharge: 2018-08-20 | Disposition: A | Discharge status: Home / Self Care | Payer: Self-pay | Attending: Emergency Medicine | Admitting: Emergency Medicine

## 2018-08-20 ENCOUNTER — Other Ambulatory Visit: Payer: Self-pay

## 2018-08-20 ENCOUNTER — Encounter (HOSPITAL_COMMUNITY): Payer: Self-pay | Admitting: Emergency Medicine

## 2018-08-20 ENCOUNTER — Emergency Department (HOSPITAL_COMMUNITY): Payer: Self-pay

## 2018-08-20 DIAGNOSIS — S6010XA Contusion of unspecified finger with damage to nail, initial encounter: Secondary | ICD-10-CM

## 2018-08-20 DIAGNOSIS — W230XXA Caught, crushed, jammed, or pinched between moving objects, initial encounter: Secondary | ICD-10-CM | POA: Insufficient documentation

## 2018-08-20 DIAGNOSIS — F17210 Nicotine dependence, cigarettes, uncomplicated: Secondary | ICD-10-CM | POA: Insufficient documentation

## 2018-08-20 DIAGNOSIS — S60121A Contusion of right index finger with damage to nail, initial encounter: Secondary | ICD-10-CM | POA: Insufficient documentation

## 2018-08-20 DIAGNOSIS — Y9389 Activity, other specified: Secondary | ICD-10-CM | POA: Insufficient documentation

## 2018-08-20 DIAGNOSIS — Y999 Unspecified external cause status: Secondary | ICD-10-CM | POA: Insufficient documentation

## 2018-08-20 DIAGNOSIS — I10 Essential (primary) hypertension: Secondary | ICD-10-CM | POA: Insufficient documentation

## 2018-08-20 DIAGNOSIS — Y929 Unspecified place or not applicable: Secondary | ICD-10-CM | POA: Insufficient documentation

## 2018-08-20 DIAGNOSIS — Z79899 Other long term (current) drug therapy: Secondary | ICD-10-CM | POA: Insufficient documentation

## 2018-08-20 MED ORDER — LIDOCAINE HCL 2 % IJ SOLN
5.0000 mL | Freq: Once | INTRAMUSCULAR | Status: AC
  Administered 2018-08-20: 100 mg
  Filled 2018-08-20: qty 20

## 2018-08-20 NOTE — ED Provider Notes (Signed)
[facility]-EMERGENCY DEPT Provider Note   CSN: [id number] Arrival date & time: 08/20/18  1402     History   Chief Complaint Chief Complaint  Patient presents with  . Finger Injury    HPI Lucan Gilberg is a 48 y.o. male.  HPI Patient presents with right index finger injury.  Shut in door accidentally 3 days ago.  Continued pain.  Not on blood thinners.  No other injury.  No numbness.  States he has difficulty bending the finger. Past Medical History:  Diagnosis Date  . Hypertension     There are no active problems to display for this patient.   History reviewed. No pertinent surgical history.      Home Medications    Prior to Admission medications   Medication Sig Start Date End Date Taking? Authorizing Provider  acetaminophen (TYLENOL) 500 MG tablet Take 1 tablet (500 mg total) by mouth every 6 (six) hours as needed. 06/02/18   Fawze, Mina A, PA-C  amLODipine (NORVASC) 5 MG tablet Take 1 tablet (5 mg total) by mouth daily. 09/30/17   Georgetta Haber, NP  cyclobenzaprine (FLEXERIL) 5 MG tablet Take 1 tablet (5 mg total) by mouth at bedtime. 05/22/18   Georgetta Haber, NP  diclofenac (VOLTAREN) 75 MG EC tablet Take 1 tablet (75 mg total) by mouth 2 (two) times daily as needed for moderate pain. 06/02/18   Fawze, Mina A, PA-C  hydrochlorothiazide (HYDRODIURIL) 12.5 MG tablet Take 1 tablet (12.5 mg total) by mouth daily. 06/02/18   Fawze, Mina A, PA-C  lisinopril (PRINIVIL,ZESTRIL) 20 MG tablet Take 1 tablet (20 mg total) by mouth daily. 09/13/12   Nelva Nay, MD  meloxicam (MOBIC) 7.5 MG tablet Take 1 tablet (7.5 mg total) by mouth daily. 05/22/18   Georgetta Haber, NP    Family History No family history on file.  Social History Social History   Tobacco Use  . Smoking status: Current Every Day Smoker    Packs/day: 0.50    Types: Cigarettes  . Smokeless tobacco: Never Used  Substance Use Topics  . Alcohol use: Yes    Comment: social    . Drug use: No     Allergies   Patient has no known allergies.   Review of Systems Review of Systems  Constitutional: Negative for fever.  Musculoskeletal:       Right index finger injury.  Neurological: Negative for weakness and numbness.     Physical Exam Updated Vital Signs BP (!) 179/110 (BP Location: Left Arm)   Pulse 86   Temp 98.6 F (37 C) (Oral)   Resp 16   Ht 5\' 10"  (1.778 m)   Wt 70.3 kg   SpO2 99%   BMI 22.24 kg/m   Physical Exam Musculoskeletal:     Comments: Tenderness and swelling over distal right index finger.  Subungual hematoma.  Skin otherwise intact.  Decreased flexion and extension at DIP joint.  Neurological:     General: No focal deficit present.     Mental Status: He is alert.      ED Treatments / Results  Labs (all labs ordered are listed, but only abnormal results are displayed) Labs Reviewed - No data to display  EKG None  Radiology Dg Finger Index Right  Result Date: 08/20/2018 CLINICAL DATA:  Right index finger pain after car door injury yesterday. EXAM: RIGHT INDEX FINGER 2+V COMPARISON:  None. FINDINGS: There is no evidence of fracture or dislocation. There is no  evidence of arthropathy or other focal bone abnormality. Soft tissues are unremarkable. IMPRESSION: Negative. Electronically Signed   By: Lupita Raider, M.D.   On: 08/20/2018 15:13    Procedures Procedures (including critical care time) Nail trephination. Patient was verbally consented for procedure.  2% lidocaine was used for digital block to achieve pain control.  After block took effect cordless cautery was used to trephinate the nail.  Moderate amount of both dark and red her blood was drained.  Sterile dressing placed.  Tolerated well.   Medications Ordered in ED Medications  lidocaine (XYLOCAINE) 2 % (with pres) injection 100 mg (100 mg Other Given 08/20/18 1621)     Initial Impression / Assessment and Plan / ED Course  I have reviewed the triage  vital signs and the nursing notes.  Pertinent labs & imaging results that were available during my care of the patient were reviewed by me and considered in my medical decision making (see chart for details).     Patient with subungual hematoma on right index finger.  X-ray does not show fracture.  Nail trephinated.  Patient feels somewhat better but had been numbed.  Discharge home.  Final Clinical Impressions(s) / ED Diagnoses   Final diagnoses:  Subungual hematoma of digit of hand, initial encounter    ED Discharge Orders    None       Benjiman Core, MD 08/20/18 1642

## 2018-08-20 NOTE — ED Triage Notes (Signed)
Patient c/o pain right index finger after "closing it in a door" x3 days ago. Bruising noted to nail bed.

## 2018-08-21 ENCOUNTER — Ambulatory Visit (HOSPITAL_COMMUNITY)
Admission: EM | Admit: 2018-08-21 | Discharge: 2018-08-21 | Disposition: A | Discharge status: Home / Self Care | Payer: Self-pay | Attending: Family Medicine | Admitting: Family Medicine

## 2018-08-21 ENCOUNTER — Encounter (HOSPITAL_COMMUNITY): Payer: Self-pay

## 2018-08-21 DIAGNOSIS — J111 Influenza due to unidentified influenza virus with other respiratory manifestations: Secondary | ICD-10-CM

## 2018-08-21 DIAGNOSIS — R69 Illness, unspecified: Secondary | ICD-10-CM | POA: Insufficient documentation

## 2018-08-21 MED ORDER — OSELTAMIVIR PHOSPHATE 75 MG PO CAPS: 75.0000 mg | ORAL_CAPSULE | Freq: Two times a day (BID) | ORAL | 0 refills | Status: DC

## 2018-08-21 NOTE — Discharge Instructions (Addendum)
We will treat your flulike illness with Tamiflu You can do Tylenol or ibuprofen for fever and body aches Follow up as needed for continued or worsening symptoms

## 2018-08-21 NOTE — ED Triage Notes (Signed)
Pt present fever, chills and body aches.  Pt has tried OTC medication but no relief.  Pt symptoms started last night.

## 2018-08-22 NOTE — ED Provider Notes (Signed)
[facility]    CSN: [id number] Arrival date & time: 08/21/18  1400     History   Chief Complaint Chief Complaint  Patient presents with  . Fever  . Generalized Body Aches  . Chills    HPI Jaime Brown is a 48 y.o. male.   Patient is a 48 year old male that presents with fever, chills, body aches.  His symptoms have been constant worsening since last night.  He has been using over-the-counter medication without much relief of his symptoms.  He has had recent sick contacts with similar symptoms.  He denies any cough, rhinorrhea, ear pain, sore throat.  He denies any nausea, vomiting, diarrhea.  No recent traveling.  ROS per HPI      Past Medical History:  Diagnosis Date  . Hypertension     There are no active problems to display for this patient.   History reviewed. No pertinent surgical history.     Home Medications    Prior to Admission medications   Medication Sig Start Date End Date Taking? Authorizing Provider  acetaminophen (TYLENOL) 500 MG tablet Take 1 tablet (500 mg total) by mouth every 6 (six) hours as needed. 06/02/18   Fawze, Mina A, PA-C  amLODipine (NORVASC) 5 MG tablet Take 1 tablet (5 mg total) by mouth daily. 09/30/17   Georgetta HaberBurky, Natalie B, NP  cyclobenzaprine (FLEXERIL) 5 MG tablet Take 1 tablet (5 mg total) by mouth at bedtime. 05/22/18   Georgetta HaberBurky, Natalie B, NP  diclofenac (VOLTAREN) 75 MG EC tablet Take 1 tablet (75 mg total) by mouth 2 (two) times daily as needed for moderate pain. 06/02/18   Fawze, Mina A, PA-C  hydrochlorothiazide (HYDRODIURIL) 12.5 MG tablet Take 1 tablet (12.5 mg total) by mouth daily. 06/02/18   Fawze, Mina A, PA-C  lisinopril (PRINIVIL,ZESTRIL) 20 MG tablet Take 1 tablet (20 mg total) by mouth daily. 09/13/12   Nelva NayBeaton, Robert, MD  meloxicam (MOBIC) 7.5 MG tablet Take 1 tablet (7.5 mg total) by mouth daily. 05/22/18   Georgetta HaberBurky, Natalie B, NP  oseltamivir (TAMIFLU) 75 MG capsule Take 1 capsule (75 mg total) by mouth every  12 (twelve) hours. 08/21/18   Janace ArisBast, Marveen Donlon A, NP    Family History History reviewed. No pertinent family history.  Social History Social History   Tobacco Use  . Smoking status: Current Every Day Smoker    Packs/day: 0.50    Types: Cigarettes  . Smokeless tobacco: Never Used  Substance Use Topics  . Alcohol use: Yes    Comment: social  . Drug use: No     Allergies   Patient has no known allergies.   Review of Systems Review of Systems   Physical Exam Triage Vital Signs ED Triage Vitals  Enc Vitals Group     BP 08/21/18 1410 102/86     Pulse Rate 08/21/18 1410 (!) 104     Resp 08/21/18 1410 16     Temp 08/21/18 1410 99.3 F (37.4 C)     Temp Source 08/21/18 1410 Skin     SpO2 08/21/18 1410 100 %     Weight --      Height --      Head Circumference --      Peak Flow --      Pain Score 08/21/18 1411 0     Pain Loc --      Pain Edu? --      Excl. in GC? --    No data found.  Updated Vital Signs BP 102/86 (BP Location: Right Arm)   Pulse (!) 104   Temp 99.3 F (37.4 C) (Skin)   Resp 16   SpO2 100%   Visual Acuity Right Eye Distance:   Left Eye Distance:   Bilateral Distance:    Right Eye Near:   Left Eye Near:    Bilateral Near:     Physical Exam Vitals signs and nursing note reviewed.  Constitutional:      Appearance: He is well-developed. He is ill-appearing.  HENT:     Head: Normocephalic and atraumatic.     Mouth/Throat:     Pharynx: Posterior oropharyngeal erythema present.  Eyes:     Conjunctiva/sclera: Conjunctivae normal.  Neck:     Musculoskeletal: Neck supple.  Cardiovascular:     Rate and Rhythm: Normal rate and regular rhythm.     Heart sounds: No murmur.  Pulmonary:     Effort: Pulmonary effort is normal. No respiratory distress.     Breath sounds: Normal breath sounds.  Abdominal:     Palpations: Abdomen is soft.     Tenderness: There is no abdominal tenderness.  Musculoskeletal: Normal range of motion.  Skin:     General: Skin is warm and dry.  Neurological:     Mental Status: He is alert.  Psychiatric:        Mood and Affect: Mood normal.      UC Treatments / Results  Labs (all labs ordered are listed, but only abnormal results are displayed) Labs Reviewed - No data to display  EKG None  Radiology Dg Finger Index Right  Result Date: 08/20/2018 CLINICAL DATA:  Right index finger pain after car door injury yesterday. EXAM: RIGHT INDEX FINGER 2+V COMPARISON:  None. FINDINGS: There is no evidence of fracture or dislocation. There is no evidence of arthropathy or other focal bone abnormality. Soft tissues are unremarkable. IMPRESSION: Negative. Electronically Signed   By: Lupita Raider, M.D.   On: 08/20/2018 15:13    Procedures Procedures (including critical care time)  Medications Ordered in UC Medications - No data to display  Initial Impression / Assessment and Plan / UC Course  I have reviewed the triage vital signs and the nursing notes.  Pertinent labs & imaging results that were available during my care of the patient were reviewed by me and considered in my medical decision making (see chart for details).     Most likely diagnosis is flulike illness. We will treat patient with Tamiflu twice a day for 5 days. He can take Tylenol or ibuprofen for pain and fever. Final Clinical Impressions(s) / UC Diagnoses   Final diagnoses:  Influenza-like illness     Discharge Instructions     We will treat your flulike illness with Tamiflu You can do Tylenol or ibuprofen for fever and body aches Follow up as needed for continued or worsening symptoms     ED Prescriptions    Medication Sig Dispense Auth. Provider   oseltamivir (TAMIFLU) 75 MG capsule Take 1 capsule (75 mg total) by mouth every 12 (twelve) hours. 10 capsule Janace Aris, NP     Controlled Substance Prescriptions [place] Controlled Substance Registry consulted? no   Janace Aris, NP 08/22/18 1002

## 2019-03-16 ENCOUNTER — Other Ambulatory Visit: Payer: Self-pay

## 2019-03-16 ENCOUNTER — Encounter (HOSPITAL_COMMUNITY): Payer: Self-pay

## 2019-03-16 ENCOUNTER — Ambulatory Visit (INDEPENDENT_AMBULATORY_CARE_PROVIDER_SITE_OTHER): Payer: 59

## 2019-03-16 ENCOUNTER — Ambulatory Visit (HOSPITAL_COMMUNITY)
Admission: EM | Admit: 2019-03-16 | Discharge: 2019-03-16 | Disposition: A | Discharge status: Home / Self Care | Payer: 59 | Attending: Internal Medicine | Admitting: Internal Medicine

## 2019-03-16 DIAGNOSIS — I10 Essential (primary) hypertension: Secondary | ICD-10-CM | POA: Diagnosis not present

## 2019-03-16 DIAGNOSIS — M7918 Myalgia, other site: Secondary | ICD-10-CM | POA: Diagnosis not present

## 2019-03-16 MED ORDER — HYDROCHLOROTHIAZIDE 12.5 MG PO TABS: 12.5000 mg | ORAL_TABLET | Freq: Every day | ORAL | 0 refills | Status: DC

## 2019-03-16 MED ORDER — AMLODIPINE BESYLATE 5 MG PO TABS: 5.0000 mg | ORAL_TABLET | Freq: Every day | ORAL | 0 refills | Status: DC

## 2019-03-16 NOTE — ED Triage Notes (Signed)
Patient presents to Urgent Care with complaints of left thigh pain since hitting it on a metal rod on the bottom of a table while at work. Patient reports he is not filing for workers' comp.

## 2019-03-16 NOTE — ED Provider Notes (Signed)
[facility]    CSN: [id number] Arrival date & time: 03/16/19  1150     History   Chief Complaint Chief Complaint  Patient presents with  . Leg Pain    HPI Jaime Brown is a 48 y.o. male.   Patient presents with left thigh pain after hitting his leg on a metal rod at work.  He denies numbness, paresthesias or weakness in his foot or leg; he denies hip pain or knee pain.  His pain is worse with movement and walking; improves with rest.  He denies other injury, head injury, LOC.    The history is provided by the patient.    Past Medical History:  Diagnosis Date  . Hypertension     There are no active problems to display for this patient.   History reviewed. No pertinent surgical history.     Home Medications    Prior to Admission medications   Medication Sig Start Date End Date Taking? Authorizing Provider  acetaminophen (TYLENOL) 500 MG tablet Take 1 tablet (500 mg total) by mouth every 6 (six) hours as needed. 06/02/18   Fawze, Mina A, PA-C  amLODipine (NORVASC) 5 MG tablet Take 1 tablet (5 mg total) by mouth daily. 03/16/19   Mickie Bailate, Joletta Manner H, NP  cyclobenzaprine (FLEXERIL) 5 MG tablet Take 1 tablet (5 mg total) by mouth at bedtime. 05/22/18   Georgetta HaberBurky, Natalie B, NP  diclofenac (VOLTAREN) 75 MG EC tablet Take 1 tablet (75 mg total) by mouth 2 (two) times daily as needed for moderate pain. 06/02/18   Fawze, Mina A, PA-C  hydrochlorothiazide (HYDRODIURIL) 12.5 MG tablet Take 1 tablet (12.5 mg total) by mouth daily. 03/16/19   Mickie Bailate, Kaetlyn Noa H, NP  lisinopril (PRINIVIL,ZESTRIL) 20 MG tablet Take 1 tablet (20 mg total) by mouth daily. 09/13/12   Nelva NayBeaton, Robert, MD  meloxicam (MOBIC) 7.5 MG tablet Take 1 tablet (7.5 mg total) by mouth daily. 05/22/18   Georgetta HaberBurky, Natalie B, NP  oseltamivir (TAMIFLU) 75 MG capsule Take 1 capsule (75 mg total) by mouth every 12 (twelve) hours. 08/21/18   Janace ArisBast, Traci A, NP    Family History Family History  Problem Relation Age of Onset  .  Hypertension Mother   . Hypertension Father     Social History Social History   Tobacco Use  . Smoking status: Current Every Day Smoker    Packs/day: 0.50    Types: Cigarettes  . Smokeless tobacco: Never Used  Substance Use Topics  . Alcohol use: Yes    Comment: social  . Drug use: No     Allergies   Patient has no known allergies.   Review of Systems Review of Systems  Constitutional: Negative for chills and fever.  HENT: Negative for ear pain and sore throat.   Eyes: Negative for pain and visual disturbance.  Respiratory: Negative for cough and shortness of breath.   Cardiovascular: Negative for chest pain and palpitations.  Gastrointestinal: Negative for abdominal pain and vomiting.  Genitourinary: Negative for dysuria and hematuria.  Musculoskeletal: Positive for myalgias. Negative for arthralgias and back pain.  Skin: Negative for color change and rash.  Neurological: Negative for seizures and syncope.  All other systems reviewed and are negative.    Physical Exam Triage Vital Signs ED Triage Vitals  Enc Vitals Group     BP 03/16/19 1211 (!) 153/108     Pulse Rate 03/16/19 1211 88     Resp 03/16/19 1211 16     Temp 03/16/19  1211 98.1 F (36.7 C)     Temp Source 03/16/19 1211 Temporal     SpO2 03/16/19 1211 98 %     Weight --      Height --      Head Circumference --      Peak Flow --      Pain Score 03/16/19 1224 7     Pain Loc --      Pain Edu? --      Excl. in GC? --    No data found.  Updated Vital Signs BP (!) 153/108 (BP Location: Left Arm)   Pulse 88   Temp 98.1 F (36.7 C) (Temporal)   Resp 16   SpO2 98%   Visual Acuity Right Eye Distance:   Left Eye Distance:   Bilateral Distance:    Right Eye Near:   Left Eye Near:    Bilateral Near:     Physical Exam Vitals signs and nursing note reviewed.  Constitutional:      General: He is not in acute distress.    Appearance: He is well-developed.  HENT:     Head: Normocephalic and  atraumatic.  Eyes:     Conjunctiva/sclera: Conjunctivae normal.  Neck:     Musculoskeletal: Neck supple.  Cardiovascular:     Rate and Rhythm: Normal rate and regular rhythm.     Heart sounds: No murmur.  Pulmonary:     Effort: Pulmonary effort is normal. No respiratory distress.     Breath sounds: Normal breath sounds.  Abdominal:     Palpations: Abdomen is soft.     Tenderness: There is no abdominal tenderness. There is no guarding or rebound.  Musculoskeletal:        General: No swelling, tenderness, deformity or signs of injury.     Right lower leg: No edema.     Left lower leg: No edema.  Skin:    General: Skin is warm and dry.     Findings: No bruising, erythema, lesion or rash.  Neurological:     General: No focal deficit present.     Mental Status: He is alert and oriented to person, place, and time.     Sensory: No sensory deficit.     Motor: No weakness.     Coordination: Coordination normal.     Gait: Gait normal.     Deep Tendon Reflexes: Reflexes normal.  Psychiatric:        Mood and Affect: Mood normal.        Behavior: Behavior normal.      UC Treatments / Results  Labs (all labs ordered are listed, but only abnormal results are displayed) Labs Reviewed - No data to display  EKG   Radiology Dg Femur Min 2 Views Left  Result Date: 03/16/2019 CLINICAL DATA:  Left thigh pain for 2 weeks after injury at work. EXAM: LEFT FEMUR 2 VIEWS COMPARISON:  None. FINDINGS: There is no evidence of fracture or other focal bone lesions. Soft tissues are unremarkable. IMPRESSION: Negative. Electronically Signed   By: Lupita RaiderJames  Green Jr M.D.   On: 03/16/2019 12:54    Procedures Procedures (including critical care time)  Medications Ordered in UC Medications - No data to display  Initial Impression / Assessment and Plan / UC Course  I have reviewed the triage vital signs and the nursing notes.  Pertinent labs & imaging results that were available during my care of  the patient were reviewed by me and considered in my medical  decision making (see chart for details).   Musculoskeletal skeletal pain.  Elevated blood pressure with known diagnosis of hypertension.  X-ray of left femur negative.  Instructed patient to take Tylenol or ibuprofen as needed for his discomfort.  Discussed with patient that his blood pressure is elevated today and that he needs to establish a PCP to follow-up with his ongoing hypertension.  Discussed that he should have his blood pressure rechecked in 1 to 2 weeks; given a 30-day supply of amlodipine and HCTZ.  Instructed patient to go to the emergency department if he develops chest pain, shortness of breath, focal weakness, facial asymmetry, speech difficulty, or other concerning symptoms.  Discussed with patient that his elevated blood pressure left untreated places him at higher risk for cardiovascular events.     Final Clinical Impressions(s) / UC Diagnoses   Final diagnoses:  Musculoskeletal pain  Elevated blood pressure reading in office with diagnosis of hypertension     Discharge Instructions     Your x-ray is normal.  Take Tylenol or ibuprofen as needed for your discomfort.  Return here or go to the emergency department if your pain continues or worsens.    Your blood pressure is elevated today at 153/108.  Have this rechecked by your primary care provider or the provider listed below in 1 to 2 weeks.  A 1 month supply of your blood pressure medications has been given to your pharmacy; please get started back on these today.          ED Prescriptions    Medication Sig Dispense Auth. Provider   amLODipine (NORVASC) 5 MG tablet Take 1 tablet (5 mg total) by mouth daily. 30 tablet Sharion Balloon, NP   hydrochlorothiazide (HYDRODIURIL) 12.5 MG tablet Take 1 tablet (12.5 mg total) by mouth daily. 30 tablet Sharion Balloon, NP     Controlled Substance Prescriptions [place] Controlled Substance Registry consulted? Yes, I have  consulted the [place] Controlled Substances Registry for this patient, and feel the risk/benefit ratio today is favorable for proceeding with this prescription for a controlled substance.   Sharion Balloon, NP 03/16/19 1304

## 2019-03-16 NOTE — Discharge Instructions (Addendum)
Your x-ray is normal.  Take Tylenol or ibuprofen as needed for your discomfort.  Return here or go to the emergency department if your pain continues or worsens.    Your blood pressure is elevated today at 153/108.  Have this rechecked by your primary care provider or the provider listed below in 1 to 2 weeks.  A 1 month supply of your blood pressure medications has been given to your pharmacy; please get started back on these today.

## 2019-10-13 DIAGNOSIS — I10 Essential (primary) hypertension: Secondary | ICD-10-CM | POA: Diagnosis not present

## 2019-10-16 IMAGING — CR DG FINGER INDEX 2+V*R*
3 series · 3 of 3 positions shown · non-contrast
Comparison: None.

CLINICAL DATA: Right index finger pain after car door injury
yesterday.

EXAM:
RIGHT INDEX FINGER 2+V

[x finger pa right]
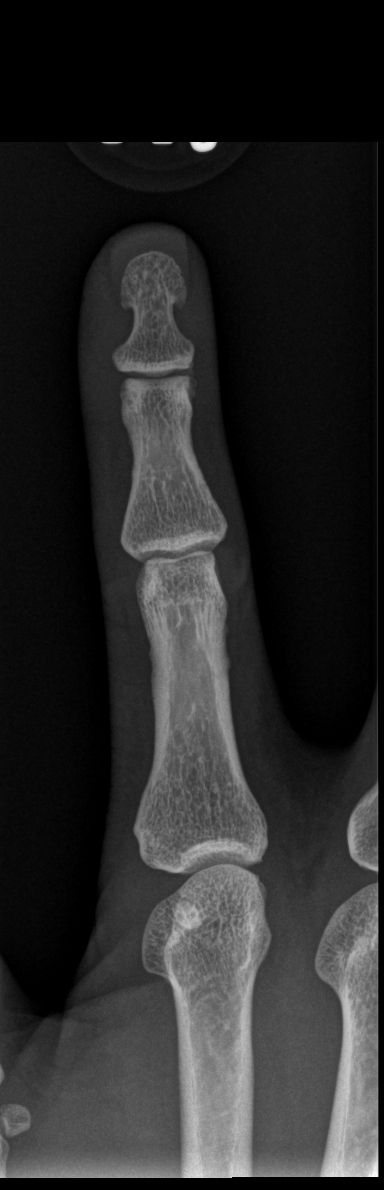

[x finger obl right]
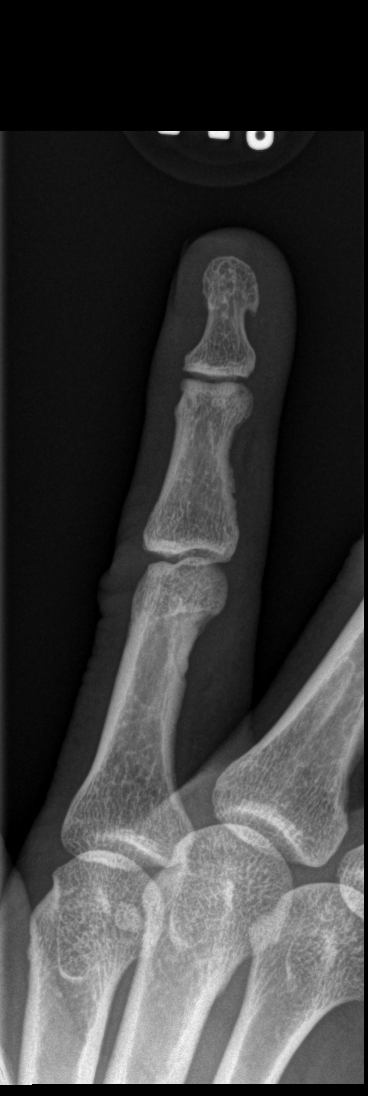

[x finger lat right]
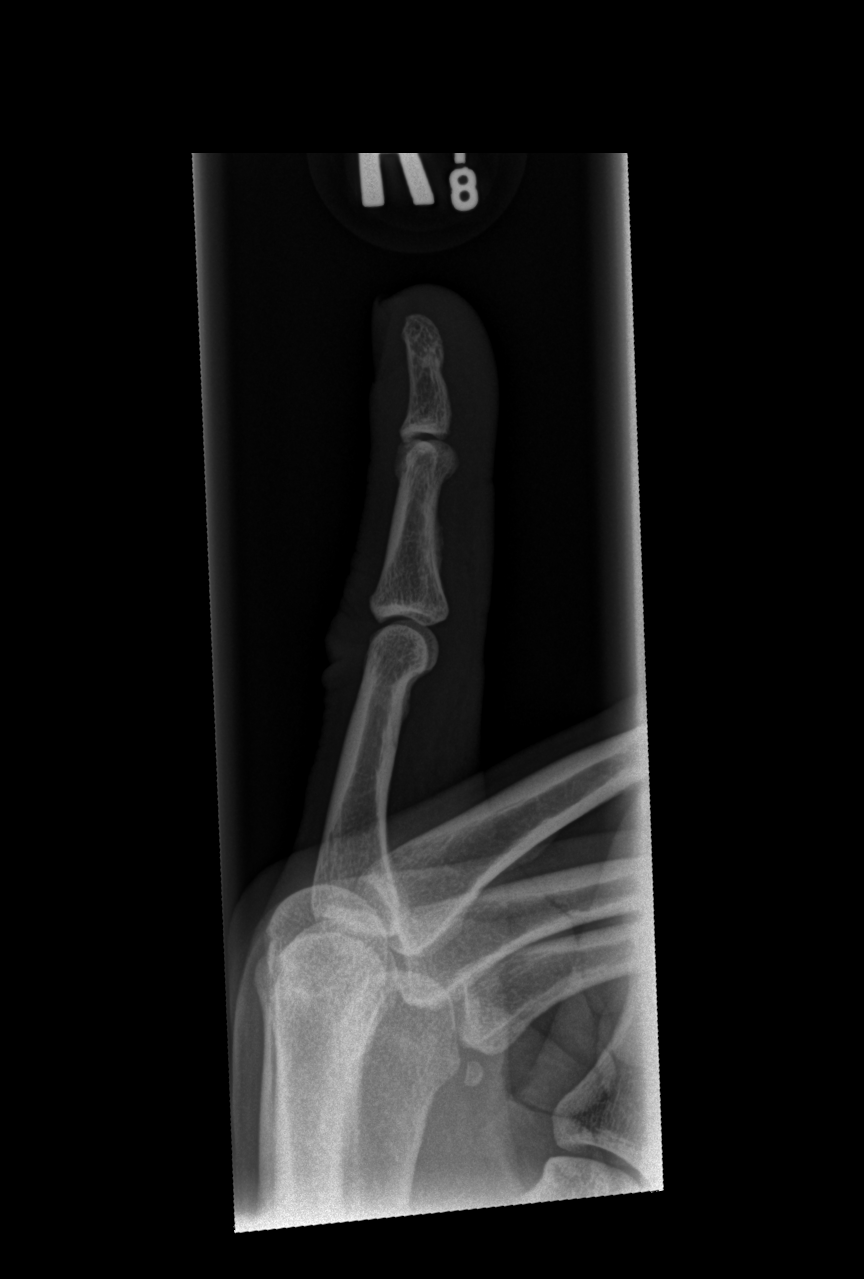

[3 of 3 positions shown; findings below may reference images not displayed]

FINDINGS: There is no evidence of fracture or dislocation. There is no
evidence of arthropathy or other focal bone abnormality. Soft
tissues are unremarkable.
IMPRESSION: Negative.

## 2019-11-10 DIAGNOSIS — I10 Essential (primary) hypertension: Secondary | ICD-10-CM | POA: Diagnosis not present

## 2019-11-11 ENCOUNTER — Other Ambulatory Visit: Payer: Self-pay

## 2019-11-11 ENCOUNTER — Emergency Department (HOSPITAL_COMMUNITY)
Admission: EM | Admit: 2019-11-11 | Discharge: 2019-11-12 | Disposition: A | Discharge status: Home / Self Care | Payer: BC Managed Care – PPO | Attending: Emergency Medicine | Admitting: Emergency Medicine

## 2019-11-11 ENCOUNTER — Encounter (HOSPITAL_COMMUNITY): Payer: Self-pay | Admitting: Emergency Medicine

## 2019-11-11 DIAGNOSIS — N179 Acute kidney failure, unspecified: Secondary | ICD-10-CM

## 2019-11-11 DIAGNOSIS — F17210 Nicotine dependence, cigarettes, uncomplicated: Secondary | ICD-10-CM | POA: Diagnosis not present

## 2019-11-11 DIAGNOSIS — I129 Hypertensive chronic kidney disease with stage 1 through stage 4 chronic kidney disease, or unspecified chronic kidney disease: Secondary | ICD-10-CM | POA: Diagnosis not present

## 2019-11-11 DIAGNOSIS — Z79899 Other long term (current) drug therapy: Secondary | ICD-10-CM | POA: Diagnosis not present

## 2019-11-11 DIAGNOSIS — N189 Chronic kidney disease, unspecified: Secondary | ICD-10-CM | POA: Diagnosis not present

## 2019-11-11 DIAGNOSIS — N289 Disorder of kidney and ureter, unspecified: Secondary | ICD-10-CM | POA: Diagnosis not present

## 2019-11-11 DIAGNOSIS — R1032 Left lower quadrant pain: Secondary | ICD-10-CM | POA: Insufficient documentation

## 2019-11-11 DIAGNOSIS — R1031 Right lower quadrant pain: Secondary | ICD-10-CM | POA: Diagnosis not present

## 2019-11-11 LAB — BASIC METABOLIC PANEL
Anion gap: 12 (ref 5–15)
BUN: 38 mg/dL — ABNORMAL HIGH (ref 6–20)
CO2: 24 mmol/L (ref 22–32)
Calcium: 9.2 mg/dL (ref 8.9–10.3)
Chloride: 100 mmol/L (ref 98–111)
Creatinine, Ser: 2.45 mg/dL — ABNORMAL HIGH (ref 0.61–1.24)
GFR calc Af Amer: 35 mL/min — ABNORMAL LOW (ref >60)
GFR calc non Af Amer: 30 mL/min — ABNORMAL LOW (ref >60)
Glucose, Bld: 108 mg/dL — ABNORMAL HIGH (ref 70–99)
Potassium: 4.4 mmol/L (ref 3.5–5.1)
Sodium: 136 mmol/L (ref 135–145)

## 2019-11-11 LAB — URINALYSIS, ROUTINE W REFLEX MICROSCOPIC
Bilirubin Urine: NEGATIVE
Glucose, UA: NEGATIVE mg/dL
Hgb urine dipstick: NEGATIVE
Ketones, ur: NEGATIVE mg/dL
Leukocytes,Ua: NEGATIVE
Nitrite: NEGATIVE
Protein, ur: NEGATIVE mg/dL
Specific Gravity, Urine: 1.021 (ref 1.005–1.030)
pH: 5 (ref 5.0–8.0)

## 2019-11-11 LAB — CBC
HCT: 39.9 % (ref 39.0–52.0)
Hemoglobin: 13.3 g/dL (ref 13.0–17.0)
MCH: 27.9 pg (ref 26.0–34.0)
MCHC: 33.3 g/dL (ref 30.0–36.0)
MCV: 83.8 fL (ref 80.0–100.0)
Platelets: 261 10*3/uL (ref 150–400)
RBC: 4.76 MIL/uL (ref 4.22–5.81)
RDW: 13.1 % (ref 11.5–15.5)
WBC: 7.7 10*3/uL (ref 4.0–10.5)
nRBC: 0 % (ref 0.0–0.2)

## 2019-11-11 NOTE — ED Triage Notes (Signed)
Pt reports having blood work done and told his kidney levels were abnormal. Endorses bilateral flank pain. Recently started on BP meds for HTN.

## 2019-11-12 ENCOUNTER — Emergency Department (HOSPITAL_COMMUNITY): Payer: BC Managed Care – PPO

## 2019-11-12 DIAGNOSIS — N179 Acute kidney failure, unspecified: Secondary | ICD-10-CM | POA: Diagnosis not present

## 2019-11-12 DIAGNOSIS — N289 Disorder of kidney and ureter, unspecified: Secondary | ICD-10-CM | POA: Diagnosis not present

## 2019-11-12 DIAGNOSIS — N2889 Other specified disorders of kidney and ureter: Secondary | ICD-10-CM | POA: Diagnosis not present

## 2019-11-12 LAB — BASIC METABOLIC PANEL
Anion gap: 7 (ref 5–15)
BUN: 35 mg/dL — ABNORMAL HIGH (ref 6–20)
CO2: 25 mmol/L (ref 22–32)
Calcium: 7.9 mg/dL — ABNORMAL LOW (ref 8.9–10.3)
Chloride: 107 mmol/L (ref 98–111)
Creatinine, Ser: 1.93 mg/dL — ABNORMAL HIGH (ref 0.61–1.24)
GFR calc Af Amer: 47 mL/min — ABNORMAL LOW (ref >60)
GFR calc non Af Amer: 40 mL/min — ABNORMAL LOW (ref >60)
Glucose, Bld: 147 mg/dL — ABNORMAL HIGH (ref 70–99)
Potassium: 3.3 mmol/L — ABNORMAL LOW (ref 3.5–5.1)
Sodium: 139 mmol/L (ref 135–145)

## 2019-11-12 MED ORDER — SODIUM CHLORIDE 0.9 % IV BOLUS
500.0000 mL | Freq: Once | INTRAVENOUS | Status: AC
  Administered 2019-11-12: 01:00:00 500 mL via INTRAVENOUS

## 2019-11-12 NOTE — Discharge Instructions (Addendum)
Please relay this information to your PCP and schedule renal ultrasound to better characterize CT findings below:  Exophytic 1.8 cm nodule rising from the lower pole the left  kidney. This could represent a hyperdense cyst or solid mass.  Follow-up with a nonemergent outpatient renal ultrasound is  recommended for further evaluation.

## 2019-11-12 NOTE — ED Provider Notes (Addendum)
[facility] EMERGENCY DEPARTMENT Provider Note  CSN: [id number] Arrival date & time: 11/11/19 1417  Chief Complaint(s) Flank Pain  HPI Jaime Brown is a 49 y.o. male with a history of hypertension recently placed on 2 blood pressure medications 1 of which is diuretic (but he is unable to verify names), here for renal insufficiency.  Patient reports that he establish care with a primary care provider last month.  Yesterday he had labs done and they called him today stating that his kidney function had worsened.  They scheduled an appointment for him in clinic in 2 days however he reports that he was concerned and wanted to be evaluated in the emergency department.  States that at the time of the phone conversation, he began to distress and started having some bilateral flank pain.  He denies any associated trauma.  No urinary symptoms.  No bladder/bowel incontinence.  No chest pain or shortness of breath.  No abdominal pain.  Patient denied any other physical complaints.  Patient has been waiting the emergency department waiting room for approximately 10 hours before my evaluation.  States that he has been eating and hydrating well.  HPI  Past Medical History Past Medical History:  Diagnosis Date  . Hypertension    There are no problems to display for this patient.  Home Medication(s) Prior to Admission medications   Medication Sig Start Date End Date Taking? Authorizing Provider  acetaminophen (TYLENOL) 500 MG tablet Take 1 tablet (500 mg total) by mouth every 6 (six) hours as needed. 06/02/18   Fawze, Mina A, PA-C  amLODipine (NORVASC) 5 MG tablet Take 1 tablet (5 mg total) by mouth daily. 03/16/19   Mickie Bail, NP  cyclobenzaprine (FLEXERIL) 5 MG tablet Take 1 tablet (5 mg total) by mouth at bedtime. 05/22/18   Georgetta Haber, NP  diclofenac (VOLTAREN) 75 MG EC tablet Take 1 tablet (75 mg total) by mouth 2 (two) times daily as needed for moderate pain. 06/02/18    Fawze, Mina A, PA-C  hydrochlorothiazide (HYDRODIURIL) 12.5 MG tablet Take 1 tablet (12.5 mg total) by mouth daily. 03/16/19   Mickie Bail, NP  lisinopril (PRINIVIL,ZESTRIL) 20 MG tablet Take 1 tablet (20 mg total) by mouth daily. 09/13/12   Nelva Nay, MD  meloxicam (MOBIC) 7.5 MG tablet Take 1 tablet (7.5 mg total) by mouth daily. 05/22/18   Georgetta Haber, NP  oseltamivir (TAMIFLU) 75 MG capsule Take 1 capsule (75 mg total) by mouth every 12 (twelve) hours. 08/21/18   Janace Aris, NP                                                                                                                                    Past Surgical History History reviewed. No pertinent surgical history. Family History Family History  Problem Relation Age of Onset  . Hypertension Mother   . Hypertension Father  Social History Social History   Tobacco Use  . Smoking status: Current Every Day Smoker    Packs/day: 0.50    Types: Cigarettes  . Smokeless tobacco: Never Used  Substance Use Topics  . Alcohol use: Yes    Comment: social  . Drug use: No   Allergies Patient has no known allergies.  Review of Systems Review of Systems All other systems are reviewed and are negative for acute change except as noted in the HPI  Physical Exam Vital Signs  I have reviewed the triage vital signs BP 118/80 (BP Location: Right Arm)   Pulse 76   Temp 97.9 F (36.6 C) (Oral)   Resp 18   Ht 5\' 10"  (1.778 m)   Wt 70.3 kg   SpO2 100%   BMI 22.24 kg/m   Physical Exam Vitals reviewed.  Constitutional:      General: He is not in acute distress.    Appearance: He is well-developed. He is not diaphoretic.  HENT:     Head: Normocephalic and atraumatic.     Jaw: No trismus.     Right Ear: External ear normal.     Left Ear: External ear normal.     Nose: Nose normal.  Eyes:     General: No scleral icterus.    Conjunctiva/sclera: Conjunctivae normal.  Neck:     Trachea: Phonation normal.    Cardiovascular:     Rate and Rhythm: Normal rate and regular rhythm.  Pulmonary:     Effort: Pulmonary effort is normal. No respiratory distress.     Breath sounds: No stridor.  Abdominal:     General: There is no distension.     Tenderness: There is no abdominal tenderness.  Musculoskeletal:        General: Normal range of motion.     Cervical back: Normal range of motion.  Neurological:     Mental Status: He is alert and oriented to person, place, and time.  Psychiatric:        Behavior: Behavior normal.     ED Results and Treatments Labs (all labs ordered are listed, but only abnormal results are displayed) Labs Reviewed  BASIC METABOLIC PANEL - Abnormal; Notable for the following components:      Result Value   Glucose, Bld 108 (*)    BUN 38 (*)    Creatinine, Ser 2.45 (*)    GFR calc non Af Amer 30 (*)    GFR calc Af Amer 35 (*)    All other components within normal limits  BASIC METABOLIC PANEL - Abnormal; Notable for the following components:   Potassium 3.3 (*)    Glucose, Bld 147 (*)    BUN 35 (*)    Creatinine, Ser 1.93 (*)    Calcium 7.9 (*)    GFR calc non Af Amer 40 (*)    GFR calc Af Amer 47 (*)    All other components within normal limits  URINALYSIS, ROUTINE W REFLEX MICROSCOPIC  CBC  EKG  EKG Interpretation  Date/Time:    Ventricular Rate:    PR Interval:    QRS Duration:   QT Interval:    QTC Calculation:   R Axis:     Text Interpretation:        Radiology No results found.  Pertinent labs & imaging results that were available during my care of the patient were reviewed by me and considered in my medical decision making (see chart for details).  Medications Ordered in ED Medications  sodium chloride 0.9 % bolus 500 mL (0 mLs Intravenous Stopped 11/12/19 0121)                                                                                                                                     Procedures Procedures  (including critical care time)  Medical Decision Making / ED Course I have reviewed the nursing notes for this encounter and the patient's prior records (if available in EHR or on provided paperwork).   Jaime Brown was evaluated in Emergency Department on 11/24/2019 for the symptoms described in the history of present illness. He was evaluated in the context of the global COVID-19 pandemic, which necessitated consideration that the patient might be at risk for infection with the SARS-CoV-2 virus that causes COVID-19. Institutional protocols and algorithms that pertain to the evaluation of patients at risk for COVID-19 are in a state of rapid change based on information released by regulatory bodies including the CDC and federal and state organizations. These policies and algorithms were followed during the patient's care in the ED.  Patient with AKI. No significant electrolyte derangements.  UA without hematuria or urinary tract infection. Possible dehydration versus medication related. We will provide small IV fluid bolus and repeat. No prior image noted on review of systems.  Will obtain a CT stone study. CT notable for left renal cyst/mass.  Patient already has follow-up appointment with PCP this Friday.  Patient informed of the findings and recommended he request a renal ultrasound to better characterize findings.  Renal function improved after fluids.      Final Clinical Impression(s) / ED Diagnoses Final diagnoses:  AKI (acute kidney injury) (HCC)  Renal lesion    Patient instructions: Please relay this information to your PCP and schedule renal ultrasound to better characterize CT findings below:  Exophytic 1.8 cm nodule rising from the lower pole the left  kidney. This could represent a hyperdense cyst or solid mass.  Follow-up with a nonemergent outpatient renal ultrasound is   recommended for further evaluation.   The patient appears reasonably screened and/or stabilized for discharge and I doubt any other medical condition or other [facility] requiring further screening, evaluation, or treatment in the ED at this time prior to discharge. Safe for discharge with strict return precautions.  Disposition: Discharge  Condition: Good  I have discussed the results, Dx and Tx plan with the patient/family who expressed  understanding and agree(s) with the plan. Discharge instructions discussed at length. The patient/family was given strict return precautions who verbalized understanding of the instructions. No further questions at time of discharge.    ED Discharge Orders    None      Follow Up: Primary care provider  Go on 11/13/2019 as scheduled     This chart was dictated using voice recognition software.  Despite best efforts to proofread,  errors can occur which can change the documentation meaning.   Nira Conn, MD 11/12/19 5366    Nira Conn, MD 11/24/19 1343

## 2019-11-12 NOTE — ED Notes (Signed)
Pt was discharged from the ED. Pt read and understood discharge paperwork. Pt had vital signs completed. Pt conscious, breathing, and A&Ox4. No distress noted. Pt speaking in complete sentences. Pt ambulated out of the ED with a smooth and steady gait. E-signature not available.  

## 2019-11-16 DIAGNOSIS — R824 Acetonuria: Secondary | ICD-10-CM | POA: Diagnosis not present

## 2019-11-16 DIAGNOSIS — N179 Acute kidney failure, unspecified: Secondary | ICD-10-CM | POA: Diagnosis not present

## 2019-11-16 DIAGNOSIS — N182 Chronic kidney disease, stage 2 (mild): Secondary | ICD-10-CM | POA: Diagnosis not present

## 2019-11-16 DIAGNOSIS — I129 Hypertensive chronic kidney disease with stage 1 through stage 4 chronic kidney disease, or unspecified chronic kidney disease: Secondary | ICD-10-CM | POA: Diagnosis not present

## 2019-11-20 DIAGNOSIS — N281 Cyst of kidney, acquired: Secondary | ICD-10-CM | POA: Diagnosis not present

## 2019-11-20 DIAGNOSIS — N179 Acute kidney failure, unspecified: Secondary | ICD-10-CM | POA: Diagnosis not present

## 2019-12-18 DIAGNOSIS — N182 Chronic kidney disease, stage 2 (mild): Secondary | ICD-10-CM | POA: Diagnosis not present

## 2019-12-18 DIAGNOSIS — N281 Cyst of kidney, acquired: Secondary | ICD-10-CM | POA: Diagnosis not present

## 2019-12-18 DIAGNOSIS — N179 Acute kidney failure, unspecified: Secondary | ICD-10-CM | POA: Diagnosis not present

## 2019-12-18 DIAGNOSIS — I129 Hypertensive chronic kidney disease with stage 1 through stage 4 chronic kidney disease, or unspecified chronic kidney disease: Secondary | ICD-10-CM | POA: Diagnosis not present

## 2020-03-29 ENCOUNTER — Other Ambulatory Visit: Payer: Self-pay

## 2020-03-29 ENCOUNTER — Encounter (HOSPITAL_BASED_OUTPATIENT_CLINIC_OR_DEPARTMENT_OTHER): Payer: Self-pay | Admitting: *Deleted

## 2020-03-29 ENCOUNTER — Emergency Department (HOSPITAL_BASED_OUTPATIENT_CLINIC_OR_DEPARTMENT_OTHER): Payer: BC Managed Care – PPO

## 2020-03-29 ENCOUNTER — Emergency Department (HOSPITAL_BASED_OUTPATIENT_CLINIC_OR_DEPARTMENT_OTHER)
Admission: EM | Admit: 2020-03-29 | Discharge: 2020-03-29 | Disposition: A | Discharge status: Home / Self Care | Payer: BC Managed Care – PPO | Attending: Emergency Medicine | Admitting: Emergency Medicine

## 2020-03-29 DIAGNOSIS — Y9289 Other specified places as the place of occurrence of the external cause: Secondary | ICD-10-CM | POA: Diagnosis not present

## 2020-03-29 DIAGNOSIS — I10 Essential (primary) hypertension: Secondary | ICD-10-CM | POA: Insufficient documentation

## 2020-03-29 DIAGNOSIS — X58XXXA Exposure to other specified factors, initial encounter: Secondary | ICD-10-CM | POA: Diagnosis not present

## 2020-03-29 DIAGNOSIS — Y999 Unspecified external cause status: Secondary | ICD-10-CM | POA: Insufficient documentation

## 2020-03-29 DIAGNOSIS — Y939 Activity, unspecified: Secondary | ICD-10-CM | POA: Diagnosis not present

## 2020-03-29 DIAGNOSIS — S76802A Unspecified injury of other specified muscles, fascia and tendons at thigh level, left thigh, initial encounter: Secondary | ICD-10-CM | POA: Diagnosis not present

## 2020-03-29 DIAGNOSIS — S76812A Strain of other specified muscles, fascia and tendons at thigh level, left thigh, initial encounter: Secondary | ICD-10-CM | POA: Diagnosis not present

## 2020-03-29 DIAGNOSIS — F17210 Nicotine dependence, cigarettes, uncomplicated: Secondary | ICD-10-CM | POA: Insufficient documentation

## 2020-03-29 DIAGNOSIS — Z79899 Other long term (current) drug therapy: Secondary | ICD-10-CM | POA: Insufficient documentation

## 2020-03-29 DIAGNOSIS — S76212A Strain of adductor muscle, fascia and tendon of left thigh, initial encounter: Secondary | ICD-10-CM

## 2020-03-29 DIAGNOSIS — M25552 Pain in left hip: Secondary | ICD-10-CM | POA: Diagnosis not present

## 2020-03-29 MED ORDER — METHOCARBAMOL 500 MG PO TABS: 500.0000 mg | ORAL_TABLET | Freq: Two times a day (BID) | ORAL | 0 refills | Status: DC

## 2020-03-29 MED ORDER — MELOXICAM 7.5 MG PO TABS: 7.5000 mg | ORAL_TABLET | Freq: Every day | ORAL | 0 refills | Status: DC

## 2020-03-29 MED ORDER — IBUPROFEN 800 MG PO TABS
800.0000 mg | ORAL_TABLET | Freq: Once | ORAL | Status: AC
  Administered 2020-03-29: 800 mg via ORAL
  Filled 2020-03-29: qty 1

## 2020-03-29 NOTE — ED Notes (Signed)
Pt discharged to home. Discharge instructions have been discussed with patient and/or family members. Pt verbally acknowledges understanding d/c instructions, and endorses comprehension to checkout at registration before leaving.  °

## 2020-03-29 NOTE — ED Triage Notes (Signed)
Left groin pain x 2 days.

## 2020-03-29 NOTE — Discharge Instructions (Signed)
Robaxin and meloxicam as prescribed.  Apply warm compresses to area for 20 minutes at a time 3 times daily. Follow-up with sports medicine if pain persists.

## 2020-03-29 NOTE — ED Provider Notes (Signed)
[facility] EMERGENCY DEPARTMENT Provider Note   CSN: [id number] Arrival date & time: 03/29/20  1355     History Chief Complaint  Patient presents with  . Leg Pain    Jaime Brown is a 49 y.o. male.  49 year old male presents with complaint of left groin pain for the past 2 days.  Pain started without injury, is worse with movement of the left leg.  Patient has not taken anything for his pain prior to arrival.  Denies swelling, skin changes, abdominal pain, testicular pain.  No other complaints or concerns today.        Past Medical History:  Diagnosis Date  . Hypertension     There are no problems to display for this patient.   History reviewed. No pertinent surgical history.     Family History  Problem Relation Age of Onset  . Hypertension Mother   . Hypertension Father     Social History   Tobacco Use  . Smoking status: Current Every Day Smoker    Packs/day: 0.50    Types: Cigarettes  . Smokeless tobacco: Never Used  Vaping Use  . Vaping Use: Never used  Substance Use Topics  . Alcohol use: Yes    Comment: social  . Drug use: No    Home Medications Prior to Admission medications   Medication Sig Start Date End Date Taking? Authorizing Provider  acetaminophen (TYLENOL) 500 MG tablet Take 1 tablet (500 mg total) by mouth every 6 (six) hours as needed. 06/02/18  Yes Fawze, Mina A, PA-C  amLODipine (NORVASC) 5 MG tablet Take 1 tablet (5 mg total) by mouth daily. 03/16/19  Yes Mickie Bail, NP  hydrochlorothiazide (HYDRODIURIL) 12.5 MG tablet Take 1 tablet (12.5 mg total) by mouth daily. 03/16/19  Yes Mickie Bail, NP  cyclobenzaprine (FLEXERIL) 5 MG tablet Take 1 tablet (5 mg total) by mouth at bedtime. 05/22/18   Georgetta Haber, NP  diclofenac (VOLTAREN) 75 MG EC tablet Take 1 tablet (75 mg total) by mouth 2 (two) times daily as needed for moderate pain. 06/02/18   Fawze, Mina A, PA-C  lisinopril (PRINIVIL,ZESTRIL) 20 MG tablet Take 1  tablet (20 mg total) by mouth daily. 09/13/12   Nelva Nay, MD  meloxicam (MOBIC) 7.5 MG tablet Take 1 tablet (7.5 mg total) by mouth daily. 03/29/20   Jeannie Fend, PA-C  methocarbamol (ROBAXIN) 500 MG tablet Take 1 tablet (500 mg total) by mouth 2 (two) times daily. 03/29/20   Jeannie Fend, PA-C  oseltamivir (TAMIFLU) 75 MG capsule Take 1 capsule (75 mg total) by mouth every 12 (twelve) hours. 08/21/18   Janace Aris, NP    Allergies    Patient has no known allergies.  Review of Systems   Review of Systems  Constitutional: Negative for fever.  Gastrointestinal: Negative for abdominal pain.  Genitourinary: Negative for testicular pain.  Musculoskeletal: Positive for arthralgias. Negative for back pain.  Skin: Negative for color change, rash and wound.  Allergic/Immunologic: Negative for immunocompromised state.  Neurological: Negative for weakness and numbness.  Hematological: Negative for adenopathy.  All other systems reviewed and are negative.   Physical Exam Updated Vital Signs BP (!) 142/93 (BP Location: Right Arm)   Pulse 66   Temp 97.9 F (36.6 C) (Oral)   Resp 16   Ht 5\' 10"  (1.778 m)   Wt 68 kg   SpO2 100%   BMI 21.52 kg/m   Physical Exam Vitals and nursing note reviewed.  Exam conducted with a chaperone present.  Constitutional:      General: He is not in acute distress.    Appearance: He is well-developed. He is not diaphoretic.  HENT:     Head: Normocephalic and atraumatic.  Cardiovascular:     Pulses: Normal pulses.  Pulmonary:     Effort: Pulmonary effort is normal.  Abdominal:     Palpations: Abdomen is soft.     Tenderness: There is no abdominal tenderness.  Musculoskeletal:        General: Tenderness present. No swelling or deformity.     Comments: No pain with logroll of left hip.  Pain is reproduced with abduction, results in left groin pain.  No significant or tender lymphadenopathy, no erythema.  Skin:    General: Skin is warm and dry.       Capillary Refill: Capillary refill takes less than 2 seconds.     Findings: No erythema or rash.  Neurological:     Mental Status: He is alert and oriented to person, place, and time.     Sensory: No sensory deficit.     Motor: No weakness.  Psychiatric:        Behavior: Behavior normal.     ED Results / Procedures / Treatments   Labs (all labs ordered are listed, but only abnormal results are displayed) Labs Reviewed - No data to display  EKG None  Radiology DG Hip Unilat With Pelvis 2-3 Views Left  Result Date: 03/29/2020 CLINICAL DATA:  LEFT hip pain for 2 days, worsened today. EXAM: DG HIP (WITH OR WITHOUT PELVIS) 2-3V LEFT COMPARISON:  Plain film of the LEFT femur dated 03/16/2019 FINDINGS: Osseous alignment is normal. No acute or suspicious osseous finding. No fracture line or displaced fracture fragment. No significant degenerative change at the LEFT hip. Soft tissues about the pelvis and LEFT hip are unremarkable. IMPRESSION: Negative. Electronically Signed   By: Bary Richard M.D.   On: 03/29/2020 15:57    Procedures Procedures (including critical care time)  Medications Ordered in ED Medications  ibuprofen (ADVIL) tablet 800 mg (800 mg Oral Given 03/29/20 1514)    ED Course  I have reviewed the triage vital signs and the nursing notes.  Pertinent labs & imaging results that were available during my care of the patient were reviewed by me and considered in my medical decision making (see chart for details).  Clinical Course as of Mar 30 1611  Tue Mar 29, 2020  [id number] 49 year old male with left groin pain for the past 2 days without injury.  On exam has pain with abduction of the hip.  X-ray unremarkable, suspect strain, recommend Robaxin and meloxicam, warm compresses, gentle stretching, follow-up sports medicine if not improving.   [LM]    Clinical Course User Index [LM] Alden Hipp   MDM Rules/Calculators/A&P                           Final  Clinical Impression(s) / ED Diagnoses Final diagnoses:  Inguinal strain, left, initial encounter    Rx / DC Orders ED Discharge Orders         Ordered    methocarbamol (ROBAXIN) 500 MG tablet  2 times daily        03/29/20 1608    meloxicam (MOBIC) 7.5 MG tablet  Daily        03/29/20 1608           Army Melia  A, PA-C 03/29/20 1612    Pricilla Loveless, MD 04/01/20 [phone]

## 2020-04-06 ENCOUNTER — Telehealth: Payer: Self-pay | Admitting: Family Medicine

## 2020-04-06 NOTE — Telephone Encounter (Signed)
Called pt to offer f/u appt w/ Dr. Jordan Likes for leg pain-- Left pt message to call office if still in pain, provider can give some follow- up treament.   Fyi.  -glh

## 2020-08-24 DIAGNOSIS — Z20822 Contact with and (suspected) exposure to covid-19: Secondary | ICD-10-CM | POA: Diagnosis not present

## 2020-08-24 DIAGNOSIS — J069 Acute upper respiratory infection, unspecified: Secondary | ICD-10-CM | POA: Diagnosis not present

## 2020-09-23 ENCOUNTER — Encounter (HOSPITAL_COMMUNITY): Payer: Self-pay | Admitting: Emergency Medicine

## 2020-09-23 ENCOUNTER — Emergency Department (HOSPITAL_COMMUNITY)
Admission: EM | Admit: 2020-09-23 | Discharge: 2020-09-23 | Disposition: A | Discharge status: Home / Self Care | Payer: BC Managed Care – PPO | Attending: Emergency Medicine | Admitting: Emergency Medicine

## 2020-09-23 ENCOUNTER — Emergency Department (HOSPITAL_COMMUNITY): Payer: BC Managed Care – PPO

## 2020-09-23 DIAGNOSIS — Z79899 Other long term (current) drug therapy: Secondary | ICD-10-CM | POA: Insufficient documentation

## 2020-09-23 DIAGNOSIS — I10 Essential (primary) hypertension: Secondary | ICD-10-CM | POA: Diagnosis not present

## 2020-09-23 DIAGNOSIS — F17210 Nicotine dependence, cigarettes, uncomplicated: Secondary | ICD-10-CM | POA: Diagnosis not present

## 2020-09-23 DIAGNOSIS — R519 Headache, unspecified: Secondary | ICD-10-CM | POA: Diagnosis not present

## 2020-09-23 DIAGNOSIS — R0789 Other chest pain: Secondary | ICD-10-CM | POA: Diagnosis not present

## 2020-09-23 DIAGNOSIS — R079 Chest pain, unspecified: Secondary | ICD-10-CM

## 2020-09-23 LAB — BASIC METABOLIC PANEL
Anion gap: 8 (ref 5–15)
BUN: 16 mg/dL (ref 6–20)
CO2: 25 mmol/L (ref 22–32)
Calcium: 9.1 mg/dL (ref 8.9–10.3)
Chloride: 107 mmol/L (ref 98–111)
Creatinine, Ser: 1.35 mg/dL — ABNORMAL HIGH (ref 0.61–1.24)
GFR, Estimated: >60 mL/min (ref >60)
Glucose, Bld: 103 mg/dL — ABNORMAL HIGH (ref 70–99)
Potassium: 4.1 mmol/L (ref 3.5–5.1)
Sodium: 140 mmol/L (ref 135–145)

## 2020-09-23 LAB — CBC
HCT: 37.6 % — ABNORMAL LOW (ref 39.0–52.0)
Hemoglobin: 12.7 g/dL — ABNORMAL LOW (ref 13.0–17.0)
MCH: 29.3 pg (ref 26.0–34.0)
MCHC: 33.8 g/dL (ref 30.0–36.0)
MCV: 86.6 fL (ref 80.0–100.0)
Platelets: 249 10*3/uL (ref 150–400)
RBC: 4.34 MIL/uL (ref 4.22–5.81)
RDW: 14.5 % (ref 11.5–15.5)
WBC: 6.9 10*3/uL (ref 4.0–10.5)
nRBC: 0 % (ref 0.0–0.2)

## 2020-09-23 LAB — TROPONIN I (HIGH SENSITIVITY)
Troponin I (High Sensitivity): 5 ng/L (ref <18)
Troponin I (High Sensitivity): 5 ng/L (ref <18)

## 2020-09-23 MED ORDER — AMLODIPINE BESYLATE 10 MG PO TABS: 10.0000 mg | ORAL_TABLET | Freq: Every day | ORAL | 0 refills | Status: DC

## 2020-09-23 MED ORDER — ASPIRIN 81 MG PO CHEW
324.0000 mg | CHEWABLE_TABLET | Freq: Once | ORAL | Status: AC
  Administered 2020-09-23: 324 mg via ORAL
  Filled 2020-09-23: qty 4

## 2020-09-23 MED ORDER — NITROGLYCERIN 0.4 MG SL SUBL
0.4000 mg | SUBLINGUAL_TABLET | SUBLINGUAL | Status: DC | PRN
  Administered 2020-09-23 (×3): 0.4 mg via SUBLINGUAL
  Filled 2020-09-23: qty 1

## 2020-09-23 NOTE — ED Triage Notes (Signed)
Pt reports that he woke up yesterday morning with an occipital headache. No improvement with tylenol. Endorses some blurry vision. Denies photophobia, nausea, or LOC. A&Ox4.

## 2020-09-23 NOTE — Discharge Instructions (Signed)
Take your blood pressure medication as prescribed. Follow-up with your primary care provider. Return to the emergency room for worsening or concerning symptoms.

## 2020-09-23 NOTE — ED Provider Notes (Signed)
[facility]-EMERGENCY DEPT Provider Note   CSN: [id number] Arrival date & time: 09/23/20  0542     History Chief Complaint  Patient presents with  . Headache    Jaime Brown is a 50 y.o. male.  50 yo male with history of HTN presents with complaint of headache onset last night, occipital area, constant, worse with lights, denies nausea or vomiting. Also chest pain onset last night located right side of chest, intermittent, feels sweaty at times, no fevers. Chest pain is worse with sitting up. No changes with bowel or bladder habits. Denies SHOB, changes with speech or gait, unilateral numbness/weakness.  Took Tylenol without any relief.   Ran out of BP meds 2-3 weeks ago (Norvasc, HCTZ dc 6 months ago due to kidney function). No heart history, daily smoker 1/2 PPD. No family cardiac history.         Past Medical History:  Diagnosis Date  . Hypertension     There are no problems to display for this patient.   History reviewed. No pertinent surgical history.     Family History  Problem Relation Age of Onset  . Hypertension Mother   . Hypertension Father     Social History   Tobacco Use  . Smoking status: Current Every Day Smoker    Packs/day: 0.50    Types: Cigarettes  . Smokeless tobacco: Never Used  Vaping Use  . Vaping Use: Never used  Substance Use Topics  . Alcohol use: Yes    Comment: social  . Drug use: No    Home Medications Prior to Admission medications   Medication Sig Start Date End Date Taking? Authorizing Provider  acetaminophen (TYLENOL) 500 MG tablet Take 1 tablet (500 mg total) by mouth every 6 (six) hours as needed. Patient taking differently: Take 500 mg by mouth every 6 (six) hours as needed for mild pain, fever or headache. 06/02/18  Yes Fawze, Mina A, PA-C  amLODipine (NORVASC) 10 MG tablet Take 1 tablet (10 mg total) by mouth daily for 14 days. 09/23/20 10/07/20  Jeannie Fend, PA-C  hydrochlorothiazide  (HYDRODIURIL) 12.5 MG tablet Take 1 tablet (12.5 mg total) by mouth daily. Patient not taking: Reported on 09/23/2020 03/16/19   Mickie Bail, NP  lisinopril (PRINIVIL,ZESTRIL) 20 MG tablet Take 1 tablet (20 mg total) by mouth daily. Patient not taking: Reported on 09/23/2020 09/13/12   Nelva Nay, MD    Allergies    Patient has no known allergies.  Review of Systems   Review of Systems  Constitutional: Positive for diaphoresis. Negative for chills and fever.  HENT: Negative for congestion.   Eyes: Positive for photophobia.  Respiratory: Negative for cough and shortness of breath.   Cardiovascular: Positive for chest pain. Negative for leg swelling.  Gastrointestinal: Negative for abdominal pain, diarrhea, nausea and vomiting.  Genitourinary: Negative for difficulty urinating.  Musculoskeletal: Negative for back pain and myalgias.  Skin: Negative for wound.  Allergic/Immunologic: Negative for immunocompromised state.  Neurological: Positive for headaches. Negative for weakness and numbness.  All other systems reviewed and are negative.   Physical Exam Updated Vital Signs BP (!) 170/99   Pulse 60   Temp 98.5 F (36.9 C) (Oral)   Resp 16   SpO2 100%   Physical Exam Vitals and nursing note reviewed.  Constitutional:      General: He is not in acute distress.    Appearance: He is well-developed and well-nourished. He is not diaphoretic.  HENT:  Head: Normocephalic and atraumatic.     Nose: Nose normal.     Mouth/Throat:     Mouth: Mucous membranes are moist.     Pharynx: Oropharynx is clear.  Eyes:     General: No visual field deficit.    Extraocular Movements: Extraocular movements intact.     Right eye: Normal extraocular motion.     Left eye: Normal extraocular motion.     Pupils: Pupils are equal, round, and reactive to light.  Cardiovascular:     Rate and Rhythm: Normal rate and regular rhythm.     Heart sounds: Normal heart sounds.  Pulmonary:     Effort:  Pulmonary effort is normal.     Breath sounds: Normal breath sounds.  Abdominal:     Palpations: Abdomen is soft.     Tenderness: There is no abdominal tenderness.  Musculoskeletal:     Cervical back: Normal range of motion and neck supple.     Right lower leg: No edema.     Left lower leg: No edema.  Skin:    General: Skin is warm and dry.  Neurological:     Mental Status: He is alert and oriented to person, place, and time.     Cranial Nerves: No cranial nerve deficit or facial asymmetry.     Sensory: No sensory deficit.     Motor: No weakness.     Gait: Gait normal.  Psychiatric:        Mood and Affect: Mood and affect normal.        Behavior: Behavior normal.     ED Results / Procedures / Treatments   Labs (all labs ordered are listed, but only abnormal results are displayed) Labs Reviewed  BASIC METABOLIC PANEL - Abnormal; Notable for the following components:      Result Value   Glucose, Bld 103 (*)    Creatinine, Ser 1.35 (*)    All other components within normal limits  CBC - Abnormal; Notable for the following components:   Hemoglobin 12.7 (*)    HCT 37.6 (*)    All other components within normal limits  TROPONIN I (HIGH SENSITIVITY)  TROPONIN I (HIGH SENSITIVITY)    EKG EKG Interpretation  Date/Time:  Friday September 23 2020 06:49:09 EST Ventricular Rate:  74 PR Interval:    QRS Duration: 82 QT Interval:  379 QTC Calculation: 421 R Axis:   80 Text Interpretation: Sinus rhythm Left ventricular hypertrophy ST elevation, consider anterolateral injury No significant change was found Confirmed by Kennis Carina [phone]) on 09/23/2020 6:53:01 AM   Radiology DG Chest Port 1 View  Result Date: 09/23/2020 CLINICAL DATA:  Chest pain, headache. EXAM: PORTABLE CHEST 1 VIEW COMPARISON:  Chest x-ray 06/01/2017 FINDINGS: The heart size and mediastinal contours are within normal limits. No focal consolidation. No pulmonary edema. No pleural effusion. No pneumothorax. No  acute osseous abnormality. IMPRESSION: No active disease. Electronically Signed   By: Tish Frederickson M.D.   On: 09/23/2020 07:01    Procedures Procedures   Medications Ordered in ED Medications  nitroGLYCERIN (NITROSTAT) SL tablet 0.4 mg (0.4 mg Sublingual Given 09/23/20 0752)  aspirin chewable tablet 324 mg (324 mg Oral Given 09/23/20 0748)    ED Course  I have reviewed the triage vital signs and the nursing notes.  Pertinent labs & imaging results that were available during my care of the patient were reviewed by me and considered in my medical decision making (see chart for details).  Clinical Course  as of 09/23/20 1006  Fri Sep 23, 2020  [id number] 50 year old male presents with complaint of headache, noncompliant with his blood pressure medication since running out about 3 weeks ago, also reports right-sided chest discomfort which is nonexertional. Patient is hypertensive on arrival with blood pressure of 171/108, was given nitro and aspirin. EKG with nonspecific ST changes, unchanged from his prior EKG. Patient has had 2 troponins which are 5 and 5, unchanged.  CBC without significant findings, BMP with creatinine of 1.35, improved from prior. Chest x-ray is unremarkable. Blood pressure has improved, currently 140/90. Patient is feeling better, headache has resolved. Patient's blood pressure medications refilled, he was encouraged to follow-up with his primary care provider and return to the emergency room for worsening or concerning symptoms. [LM]    Clinical Course User Index [LM] Alden Hipp   MDM Rules/Calculators/A&P                          Final Clinical Impression(s) / ED Diagnoses Final diagnoses:  Acute nonintractable headache, unspecified headache type  Hypertension, unspecified type  Chest pain, unspecified type    Rx / DC Orders ED Discharge Orders         Ordered    amLODipine (NORVASC) 10 MG tablet  Daily        09/23/20 1001            Alden Hipp 09/23/20 1006    Lorre Nick, MD 09/27/20 1016

## 2020-11-29 DIAGNOSIS — L989 Disorder of the skin and subcutaneous tissue, unspecified: Secondary | ICD-10-CM | POA: Diagnosis not present

## 2021-01-10 ENCOUNTER — Other Ambulatory Visit: Payer: Self-pay

## 2021-01-10 ENCOUNTER — Ambulatory Visit: Payer: Self-pay | Admitting: Physician Assistant

## 2021-01-10 VITALS — BP 145/89 | HR 92 | Temp 98.2°F | Resp 18 | Ht 70.0 in | Wt 173.0 lb

## 2021-01-10 DIAGNOSIS — Z114 Encounter for screening for human immunodeficiency virus [HIV]: Secondary | ICD-10-CM

## 2021-01-10 DIAGNOSIS — Z1159 Encounter for screening for other viral diseases: Secondary | ICD-10-CM

## 2021-01-10 DIAGNOSIS — E559 Vitamin D deficiency, unspecified: Secondary | ICD-10-CM

## 2021-01-10 DIAGNOSIS — I10 Essential (primary) hypertension: Secondary | ICD-10-CM

## 2021-01-10 MED ORDER — LISINOPRIL 10 MG PO TABS: 10.0000 mg | ORAL_TABLET | Freq: Every day | ORAL | 1 refills | Status: DC

## 2021-01-10 MED ORDER — AMLODIPINE BESYLATE 10 MG PO TABS: 10.0000 mg | ORAL_TABLET | Freq: Every day | ORAL | 1 refills | Status: DC

## 2021-01-10 NOTE — Progress Notes (Signed)
New Patient Office Visit  Subjective:  Patient ID: Jaime Brown, male    DOB: 03/03/1971  Age: 50 y.o. MRN: [id number]  CC:  Chief Complaint  Patient presents with  . Medication Problem    HPI Josuel Koeppen reports that he was previously on 12.5 mg of hydrochlorothiazide and 20 mg of lisinopril for the past year, was taken off of hydrochlorothiazide due to kidney issues, is currently only taking amlodipine 10 mg. Has been on Amlodipine by itself since April 2021, consistent for the past 2 weeks.  Readings in the morning are consistent with today's reading, however readings in the evenings have been approximately 160/95-174/98.  Is following a lower sodium diet  Patient is currently a resident at [facility] recovery for substance abuse.  Past Medical History:  Diagnosis Date  . Hypertension     History reviewed. No pertinent surgical history.  Family History  Problem Relation Age of Onset  . Hypertension Mother   . Hypertension Father     Social History   Socioeconomic History  . Marital status: Single    Spouse name: Not on file  . Number of children: Not on file  . Years of education: Not on file  . Highest education level: Not on file  Occupational History  . Not on file  Tobacco Use  . Smoking status: Current Every Day Smoker    Packs/day: 0.50    Types: Cigarettes  . Smokeless tobacco: Never Used  Vaping Use  . Vaping Use: Never used  Substance and Sexual Activity  . Alcohol use: Yes    Comment: social  . Drug use: No  . Sexual activity: Not on file  Other Topics Concern  . Not on file  Social History Narrative  . Not on file   Social Determinants of Health   Financial Resource Strain: Not on file  Food Insecurity: Not on file  Transportation Needs: Not on file  Physical Activity: Not on file  Stress: Not on file  Social Connections: Not on file  Intimate Partner Violence: Not on file    ROS Review of Systems  Constitutional: Negative.    HENT: Negative.   Eyes: Negative.   Respiratory: Negative for shortness of breath.   Cardiovascular: Negative for chest pain and palpitations.  Gastrointestinal: Negative.   Endocrine: Negative.   Genitourinary: Negative.   Musculoskeletal: Negative.   Skin: Negative.   Allergic/Immunologic: Negative.   Neurological: Negative for headaches.  Hematological: Negative.   Psychiatric/Behavioral: Negative for dysphoric mood, self-injury, sleep disturbance and suicidal ideas. The patient is not nervous/anxious.     Objective:   Today's Vitals: BP (!) 145/89 (BP Location: Left Arm, Patient Position: Sitting, Cuff Size: Normal)   Pulse 92   Temp 98.2 F (36.8 C) (Oral)   Resp 18   Ht 5\' 10"  (1.778 m)   Wt 173 lb (78.5 kg)   SpO2 99%   BMI 24.82 kg/m   Physical Exam Vitals and nursing note reviewed.  Constitutional:      Appearance: Normal appearance.  HENT:     Head: Normocephalic and atraumatic.     Right Ear: External ear normal.     Left Ear: External ear normal.     Nose: Nose normal.     Mouth/Throat:     Mouth: Mucous membranes are moist.     Pharynx: Oropharynx is clear.  Eyes:     Extraocular Movements: Extraocular movements intact.     Conjunctiva/sclera: Conjunctivae normal.  Pupils: Pupils are equal, round, and reactive to light.  Cardiovascular:     Rate and Rhythm: Normal rate and regular rhythm.     Pulses: Normal pulses.     Heart sounds: Normal heart sounds.  Pulmonary:     Effort: Pulmonary effort is normal.     Breath sounds: Normal breath sounds.  Musculoskeletal:        General: Normal range of motion.     Cervical back: Normal range of motion and neck supple.  Skin:    General: Skin is warm and dry.  Neurological:     General: No focal deficit present.     Mental Status: He is alert and oriented to person, place, and time.  Psychiatric:        Mood and Affect: Mood normal.        Behavior: Behavior normal.        Thought Content: Thought  content normal.        Judgment: Judgment normal.     Assessment & Plan:   Problem List Items Addressed This Visit      Cardiovascular and Mediastinum   Essential hypertension - Primary   Relevant Medications   amLODipine (NORVASC) 10 MG tablet   lisinopril (ZESTRIL) 10 MG tablet   Other Relevant Orders   CBC with Differential/Platelet (Completed)   Comp. Metabolic Panel (12) (Completed)   TSH (Completed)     Other   Vitamin D deficiency   Relevant Orders   Vitamin D, 25-hydroxy (Completed)    Other Visit Diagnoses    Screening for HIV without presence of risk factors       Relevant Orders   HIV antibody (with reflex) (Completed)   Encounter for HCV screening test for low risk patient       Relevant Orders   HCV Ab w Reflex to Quant PCR (Completed)      Outpatient Encounter Medications as of 01/10/2021  Medication Sig  . lisinopril (ZESTRIL) 10 MG tablet Take 1 tablet (10 mg total) by mouth daily.  . [DISCONTINUED] acetaminophen (TYLENOL) 500 MG tablet Take 1 tablet (500 mg total) by mouth every 6 (six) hours as needed. (Patient taking differently: Take 500 mg by mouth every 6 (six) hours as needed for mild pain, fever or headache.)  . [DISCONTINUED] amLODipine (NORVASC) 10 MG tablet Take 1 tablet (10 mg total) by mouth daily for 14 days.  Marland Kitchen amLODipine (NORVASC) 10 MG tablet Take 1 tablet (10 mg total) by mouth daily.  . [DISCONTINUED] hydrochlorothiazide (HYDRODIURIL) 12.5 MG tablet Take 1 tablet (12.5 mg total) by mouth daily. (Patient not taking: Reported on 01/10/2021)  . [DISCONTINUED] lisinopril (PRINIVIL,ZESTRIL) 20 MG tablet Take 1 tablet (20 mg total) by mouth daily. (Patient not taking: No sig reported)   No facility-administered encounter medications on file as of 01/10/2021.   1. Essential hypertension Continue amlodipine, resume lisinopril at lower dose 10 mg.  Patient encouraged to continue following low-sodium diet, checking blood pressure twice a day and  keeping written log.  Patient to return to mobile unit in 2 weeks for further review.   - amLODipine (NORVASC) 10 MG tablet; Take 1 tablet (10 mg total) by mouth daily.  Dispense: 30 tablet; Refill: 1 - CBC with Differential/Platelet - Comp. Metabolic Panel (12) - TSH - lisinopril (ZESTRIL) 10 MG tablet; Take 1 tablet (10 mg total) by mouth daily.  Dispense: 30 tablet; Refill: 1  2. Vitamin D deficiency  - Vitamin D, 25-hydroxy  3. Screening  for HIV without presence of risk factors  - HIV antibody (with reflex)  4. Encounter for HCV screening test for low risk patient  - HCV Ab w Reflex to Quant PCR    I have reviewed the patient's medical history (PMH, PSH, Social History, Family History, Medications, and allergies) , and have been updated if relevant. I spent 32 minutes reviewing chart and  face to face time with patient.     Follow-up: Return in about 2 weeks (around 01/24/2021).   Kasandra Knudsen Mayers, PA-C

## 2021-01-10 NOTE — Patient Instructions (Signed)
For your elevated blood pressure, you are going to continue taking amlodipine 10 mg, and you will resume lisinopril at 10 mg.  Continue checking your blood pressure twice a day, keeping a written log and have available for all office visits.  Please return to the mobile unit in 2 weeks to follow-up on blood pressure.  I do encourage you to follow a low-sodium diet as well.  We will call you with your lab results.  Roney Jaffe, PA-C Physician Assistant [facility] [email or web address]    Low-Sodium Eating Plan Sodium, which is an element that makes up salt, helps you maintain a healthy balance of fluids in your body. Too much sodium can increase your blood pressure and cause fluid and waste to be held in your body. Your health care provider or dietitian may recommend following this plan if you have high blood pressure (hypertension), kidney disease, liver disease, or heart failure. Eating less sodium can help lower your blood pressure, reduce swelling, and protect your heart, liver, and kidneys. What are tips for following this plan? Reading food labels  The Nutrition Facts label lists the amount of sodium in one serving of the food. If you eat more than one serving, you must multiply the listed amount of sodium by the number of servings.  Choose foods with less than 140 mg of sodium per serving.  Avoid foods with 300 mg of sodium or more per serving. Shopping  Look for lower-sodium products, often labeled as "low-sodium" or "no salt added."  Always check the sodium content, even if foods are labeled as "unsalted" or "no salt added."  Buy fresh foods. ? Avoid canned foods and pre-made or frozen meals. ? Avoid canned, cured, or processed meats.  Buy breads that have less than 80 mg of sodium per slice.   Cooking  Eat more home-cooked food and less restaurant, buffet, and fast food.  Avoid adding salt when cooking. Use  salt-free seasonings or herbs instead of table salt or sea salt. Check with your health care provider or pharmacist before using salt substitutes.  Cook with plant-based oils, such as canola, sunflower, or olive oil.   Meal planning  When eating at a restaurant, ask that your food be prepared with less salt or no salt, if possible. Avoid dishes labeled as brined, pickled, cured, smoked, or made with soy sauce, miso, or teriyaki sauce.  Avoid foods that contain MSG (monosodium glutamate). MSG is sometimes added to [place] food, bouillon, and some canned foods.  Make meals that can be grilled, baked, poached, roasted, or steamed. These are generally made with less sodium. General information Most people on this plan should limit their sodium intake to 1,500-2,000 mg (milligrams) of sodium each day. What foods should I eat? Fruits Fresh, frozen, or canned fruit. Fruit juice. Vegetables Fresh or frozen vegetables. "No salt added" canned vegetables. "No salt added" tomato sauce and paste. Low-sodium or reduced-sodium tomato and vegetable juice. Grains Low-sodium cereals, including oats, puffed wheat and rice, and shredded wheat. Low-sodium crackers. Unsalted rice. Unsalted pasta. Low-sodium bread. Whole-grain breads and whole-grain pasta. Meats and other proteins Fresh or frozen (no salt added) meat, poultry, seafood, and fish. Low-sodium canned tuna and salmon. Unsalted nuts. Dried peas, beans, and lentils without added salt. Unsalted canned beans. Eggs. Unsalted nut butters. Dairy Milk. Soy milk. Cheese that is naturally low in sodium, such as ricotta cheese, fresh mozzarella, or Swiss cheese. Low-sodium or reduced-sodium cheese. Cream cheese. Yogurt. Seasonings and condiments Fresh  and dried herbs and spices. Salt-free seasonings. Low-sodium mustard and ketchup. Sodium-free salad dressing. Sodium-free light mayonnaise. Fresh or refrigerated horseradish. Lemon juice. Vinegar. Other  foods Homemade, reduced-sodium, or low-sodium soups. Unsalted popcorn and pretzels. Low-salt or salt-free chips. The items listed above may not be a complete list of foods and beverages you can eat. Contact a dietitian for more information. What foods should I avoid? Vegetables Sauerkraut, pickled vegetables, and relishes. Olives. [place] fries. Onion rings. Regular canned vegetables (not low-sodium or reduced-sodium). Regular canned tomato sauce and paste (not low-sodium or reduced-sodium). Regular tomato and vegetable juice (not low-sodium or reduced-sodium). Frozen vegetables in sauces. Grains Instant hot cereals. Bread stuffing, pancake, and biscuit mixes. Croutons. Seasoned rice or pasta mixes. Noodle soup cups. Boxed or frozen macaroni and cheese. Regular salted crackers. Self-rising flour. Meats and other proteins Meat or fish that is salted, canned, smoked, spiced, or pickled. Precooked or cured meat, such as sausages or meat loaves. Tomasa Blase. Ham. Pepperoni. Hot dogs. Corned beef. Chipped beef. Salt pork. Jerky. Pickled herring. Anchovies and sardines. Regular canned tuna. Salted nuts. Dairy Processed cheese and cheese spreads. Hard cheeses. Cheese curds. Blue cheese. Feta cheese. String cheese. Regular cottage cheese. Buttermilk. Canned milk. Fats and oils Salted butter. Regular margarine. Ghee. Bacon fat. Seasonings and condiments Onion salt, garlic salt, seasoned salt, table salt, and sea salt. Canned and packaged gravies. Worcestershire sauce. Tartar sauce. Barbecue sauce. Teriyaki sauce. Soy sauce, including reduced-sodium. Steak sauce. Fish sauce. Oyster sauce. Cocktail sauce. Horseradish that you find on the shelf. Regular ketchup and mustard. Meat flavorings and tenderizers. Bouillon cubes. Hot sauce. Pre-made or packaged marinades. Pre-made or packaged taco seasonings. Relishes. Regular salad dressings. Salsa. Other foods Salted popcorn and pretzels. Corn chips and puffs. Potato and  tortilla chips. Canned or dried soups. Pizza. Frozen entrees and pot pies. The items listed above may not be a complete list of foods and beverages you should avoid. Contact a dietitian for more information. Summary  Eating less sodium can help lower your blood pressure, reduce swelling, and protect your heart, liver, and kidneys.  Most people on this plan should limit their sodium intake to 1,500-2,000 mg (milligrams) of sodium each day.  Canned, boxed, and frozen foods are high in sodium. Restaurant foods, fast foods, and pizza are also very high in sodium. You also get sodium by adding salt to food.  Try to cook at home, eat more fresh fruits and vegetables, and eat less fast food and canned, processed, or prepared foods. This information is not intended to replace advice given to you by your health care provider. Make sure you discuss any questions you have with your health care provider. Document Revised: 08/28/2019 Document Reviewed: 06/24/2019 Elsevier Patient Education  2021 [employer].

## 2021-01-10 NOTE — Progress Notes (Signed)
Patient has eaten today and patient has eaten today. Patient denies pain at this time. Patient reports HCTZ being stopped and only taking amlodipine for 11/2019.

## 2021-01-11 LAB — CBC WITH DIFFERENTIAL/PLATELET
Basophils Absolute: 0.1 10*3/uL (ref 0.0–0.2)
Basos: 1 %
EOS (ABSOLUTE): 0.1 10*3/uL (ref 0.0–0.4)
Eos: 2 %
Hematocrit: 39.4 % (ref 37.5–51.0)
Hemoglobin: 13 g/dL (ref 13.0–17.7)
Immature Grans (Abs): 0 10*3/uL (ref 0.0–0.1)
Immature Granulocytes: 0 %
Lymphocytes Absolute: 2.3 10*3/uL (ref 0.7–3.1)
Lymphs: 33 %
MCH: 28.1 pg (ref 26.6–33.0)
MCHC: 33 g/dL (ref 31.5–35.7)
MCV: 85 fL (ref 79–97)
Monocytes Absolute: 0.7 10*3/uL (ref 0.1–0.9)
Monocytes: 10 %
Neutrophils Absolute: 3.7 10*3/uL (ref 1.4–7.0)
Neutrophils: 54 %
Platelets: 251 10*3/uL (ref 150–450)
RBC: 4.62 x10E6/uL (ref 4.14–5.80)
RDW: 13.7 % (ref 11.6–15.4)
WBC: 6.9 10*3/uL (ref 3.4–10.8)

## 2021-01-11 LAB — COMP. METABOLIC PANEL (12)
AST: 21 IU/L (ref 0–40)
Albumin/Globulin Ratio: 1.4 (ref 1.2–2.2)
Albumin: 4.2 g/dL (ref 4.0–5.0)
Alkaline Phosphatase: 63 IU/L (ref 44–121)
BUN/Creatinine Ratio: 11 (ref 9–20)
BUN: 17 mg/dL (ref 6–24)
Bilirubin Total: 0.2 mg/dL (ref 0.0–1.2)
Calcium: 9.3 mg/dL (ref 8.7–10.2)
Chloride: 104 mmol/L (ref 96–106)
Creatinine, Ser: 1.53 mg/dL — ABNORMAL HIGH (ref 0.76–1.27)
Globulin, Total: 2.9 g/dL (ref 1.5–4.5)
Glucose: 115 mg/dL — ABNORMAL HIGH (ref 65–99)
Potassium: 4.4 mmol/L (ref 3.5–5.2)
Sodium: 140 mmol/L (ref 134–144)
Total Protein: 7.1 g/dL (ref 6.0–8.5)
eGFR: 56 mL/min/{1.73_m2} — ABNORMAL LOW (ref >59)

## 2021-01-11 LAB — VITAMIN D 25 HYDROXY (VIT D DEFICIENCY, FRACTURES): Vit D, 25-Hydroxy: 15.5 ng/mL — ABNORMAL LOW (ref 30.0–100.0)

## 2021-01-11 LAB — TSH: TSH: 0.903 u[IU]/mL (ref 0.450–4.500)

## 2021-01-11 LAB — HCV INTERPRETATION

## 2021-01-11 LAB — HCV AB W REFLEX TO QUANT PCR: HCV Ab: 0.4 s/co ratio (ref 0.0–0.9)

## 2021-01-11 LAB — HIV ANTIBODY (ROUTINE TESTING W REFLEX): HIV Screen 4th Generation wRfx: NONREACTIVE

## 2021-01-11 MED ORDER — VITAMIN D (ERGOCALCIFEROL) 1.25 MG (50000 UNIT) PO CAPS: 50000.0000 [IU] | ORAL_CAPSULE | ORAL | 2 refills | Status: AC

## 2021-01-11 NOTE — Addendum Note (Signed)
Addended by: Roney Jaffe on: 01/11/2021 01:59 PM   Modules accepted: Orders

## 2021-01-18 ENCOUNTER — Telehealth: Payer: Self-pay | Admitting: *Deleted

## 2021-01-18 NOTE — Telephone Encounter (Signed)
-----   Message from Roney Jaffe, [place] sent at 01/11/2021  1:59 PM EDT ----- Please call patient and let him know that his thyroid function, and liver function are within normal limits.  His kidney function is slightly decreased, please encourage him to increase his hydration.  We will continue to monitor this.  His screening for hepatitis C and HIV were negative.  His vitamin D is low.  He needs to take 50,000 units once a week for the next 12 weeks.  He should have it rechecked at that point.  Prescription sent to his pharmacy.

## 2021-01-18 NOTE — Telephone Encounter (Signed)
MA UTR patient with no VM option.

## 2021-01-23 ENCOUNTER — Ambulatory Visit: Payer: Self-pay | Admitting: Physician Assistant

## 2021-01-23 ENCOUNTER — Other Ambulatory Visit: Payer: Self-pay

## 2021-01-23 VITALS — BP 154/87 | HR 84 | Temp 98.2°F | Resp 18 | Ht 70.0 in | Wt 180.0 lb

## 2021-01-23 DIAGNOSIS — E559 Vitamin D deficiency, unspecified: Secondary | ICD-10-CM

## 2021-01-23 DIAGNOSIS — I10 Essential (primary) hypertension: Secondary | ICD-10-CM

## 2021-01-23 DIAGNOSIS — N289 Disorder of kidney and ureter, unspecified: Secondary | ICD-10-CM

## 2021-01-23 DIAGNOSIS — F1011 Alcohol abuse, in remission: Secondary | ICD-10-CM

## 2021-01-23 MED ORDER — LISINOPRIL 20 MG PO TABS: 20.0000 mg | ORAL_TABLET | Freq: Every day | ORAL | 1 refills | Status: DC

## 2021-01-23 NOTE — Progress Notes (Signed)
Established Patient Office Visit  Subjective:  Patient ID: Jaime Brown, male    DOB: Feb 10, 1971  Age: 50 y.o. MRN: [id number]  CC:  Chief Complaint  Patient presents with   Follow-up     BP   Hypertension    HPI Zyheir Daft reports that he has been compliant to his blood pressure medication, has not had any adverse effects since starting lisinopril.  Reports that his blood pressure readings have continued to be elevated.  States that he is following a low-sodium diet as well.  Reports that he did start the vitamin D once weekly treatment.   States that he will be leaving the [facility] treatment program at the end of this week, states that he will be returning home to begin working.     Past Medical History:  Diagnosis Date   Hypertension     History reviewed. No pertinent surgical history.  Family History  Problem Relation Age of Onset   Hypertension Mother    Hypertension Father     Social History   Socioeconomic History   Marital status: Single    Spouse name: Not on file   Number of children: Not on file   Years of education: Not on file   Highest education level: Not on file  Occupational History   Not on file  Tobacco Use   Smoking status: Every Day    Packs/day: 0.50    Pack years: 0.00    Types: Cigarettes   Smokeless tobacco: Never  Vaping Use   Vaping Use: Never used  Substance and Sexual Activity   Alcohol use: Yes    Comment: social   Drug use: No   Sexual activity: Not on file  Other Topics Concern   Not on file  Social History Narrative   Not on file   Social Determinants of Health   Financial Resource Strain: Not on file  Food Insecurity: Not on file  Transportation Needs: Not on file  Physical Activity: Not on file  Stress: Not on file  Social Connections: Not on file  Intimate Partner Violence: Not on file    Outpatient Medications Prior to Visit  Medication Sig Dispense Refill   amLODipine (NORVASC) 10 MG tablet Take 1  tablet (10 mg total) by mouth daily. 30 tablet 1   Vitamin D, Ergocalciferol, (DRISDOL) 1.25 MG (50000 UNIT) CAPS capsule Take 1 capsule (50,000 Units total) by mouth every 7 (seven) days. 4 capsule 2   lisinopril (ZESTRIL) 10 MG tablet Take 1 tablet (10 mg total) by mouth daily. 30 tablet 1   No facility-administered medications prior to visit.    No Known Allergies  ROS Review of Systems  Constitutional:  Negative for chills and fever.  HENT: Negative.    Eyes: Negative.   Respiratory:  Negative for shortness of breath.   Cardiovascular:  Negative for chest pain.  Gastrointestinal: Negative.   Endocrine: Negative.   Genitourinary: Negative.   Musculoskeletal: Negative.   Skin: Negative.   Allergic/Immunologic: Negative.   Neurological: Negative.   Hematological: Negative.   Psychiatric/Behavioral: Negative.       Objective:    Physical Exam Vitals and nursing note reviewed.  Constitutional:      Appearance: Normal appearance.  HENT:     Head: Normocephalic and atraumatic.     Right Ear: External ear normal.     Left Ear: External ear normal.     Nose: Nose normal.     Mouth/Throat:  Mouth: Mucous membranes are moist.     Pharynx: Oropharynx is clear.  Eyes:     Extraocular Movements: Extraocular movements intact.     Conjunctiva/sclera: Conjunctivae normal.     Pupils: Pupils are equal, round, and reactive to light.  Cardiovascular:     Rate and Rhythm: Normal rate and regular rhythm.     Pulses: Normal pulses.     Heart sounds: Normal heart sounds.  Pulmonary:     Effort: Pulmonary effort is normal.     Breath sounds: Normal breath sounds.  Musculoskeletal:        General: Normal range of motion.     Cervical back: Normal range of motion and neck supple.  Skin:    General: Skin is warm and dry.  Neurological:     General: No focal deficit present.     Mental Status: He is alert and oriented to person, place, and time.  Psychiatric:        Mood and  Affect: Mood normal.        Behavior: Behavior normal.        Thought Content: Thought content normal.        Judgment: Judgment normal.    BP (!) 154/87 (BP Location: Left Arm, Patient Position: Sitting, Cuff Size: Normal)   Pulse 84   Temp 98.2 F (36.8 C) (Oral)   Resp 18   Ht _0  (1.778 m)   Wt 180 lb (81.6 kg)   SpO2 100%   BMI 25.83 kg/m  Wt Readings from Last 3 Encounters:  01/23/21 180 lb (81.6 kg)  01/10/21 173 lb (78.5 kg)  03/29/20 150 lb (68 kg)     Health Maintenance Due  Topic Date Due   COVID-19 Vaccine (1) Never done   Pneumococcal Vaccine 80-66 Years old (1 - PCV) Never done   TETANUS/TDAP  Never done   COLONOSCOPY (Pts 45-25yr Insurance coverage will need to be confirmed)  Never done    There are no preventive care reminders to display for this patient.  Lab Results  Component Value Date   TSH 0.903 01/10/2021   Lab Results  Component Value Date   WBC 6.9 01/10/2021   HGB 13.0 01/10/2021   HCT 39.4 01/10/2021   MCV 85 01/10/2021   PLT 251 01/10/2021   Lab Results  Component Value Date   NA 140 01/10/2021   K 4.4 01/10/2021   CO2 25 09/23/2020   GLUCOSE 115 (H) 01/10/2021   BUN 17 01/10/2021   CREATININE 1.53 (H) 01/10/2021   BILITOT 0.2 01/10/2021   ALKPHOS 63 01/10/2021   AST 21 01/10/2021   ALT 9 (L) 07/22/2017   PROT 7.1 01/10/2021   ALBUMIN 4.2 01/10/2021   CALCIUM 9.3 01/10/2021   ANIONGAP 8 09/23/2020   EGFR 56 (L) 01/10/2021   No results found for: CHOL No results found for: HDL No results found for: LDLCALC No results found for: TRIG No results found for: CHOLHDL No results found for: HGBA1C    Assessment & Plan:   Problem List Items Addressed This Visit       Cardiovascular and Mediastinum   Essential hypertension - Primary   Relevant Medications   lisinopril (ZESTRIL) 20 MG tablet     Other   Vitamin D deficiency   Abnormal kidney function   Alcohol abuse, in remission    Meds ordered this encounter   Medications   lisinopril (ZESTRIL) 20 MG tablet    Sig: Take 1 tablet (  20 mg total) by mouth daily.    Dispense:  30 tablet    Refill:  1    Dose change    Order Specific Question:   Supervising Provider    Answer:   Joya Gaskins, PATRICK E [[id number]]  1. Essential hypertension Continue amlodipine, increase lisinopril 20 mg.  Patient will return to mobile unit next week, does request waiting until that visit to receive packet for [facility] health financial assistance.  Will assign patient to primary care provider at that visit.  Continue checking blood pressure at home, keep a written log and have available for all office visits, continue low-sodium diet.  Red flags given for prompt reevaluation. - lisinopril (ZESTRIL) 20 MG tablet; Take 1 tablet (20 mg total) by mouth daily.  Dispense: 30 tablet; Refill: 1  2. Vitamin D deficiency Continue current treatment plan, patient education given on importance of supplementation  3. Abnormal kidney function Will recheck at next office visit along with fasting labs  4. Alcohol abuse, in remission Currently in residential treatment program   I have reviewed the patient's medical history (PMH, PSH, Social History, Family History, Medications, and allergies) , and have been updated if relevant. I spent 32 minutes reviewing chart and  face to face time with patient.     Follow-up: Return in about 1 week (around 01/30/2021).    Loraine Grip Mayers, PA-C

## 2021-01-23 NOTE — Progress Notes (Signed)
Patient has eaten and taken medication today. Patient denies pain at this time.  

## 2021-01-23 NOTE — Progress Notes (Signed)
Patient was made aware of results verbally in person and received a printed copy during visit.

## 2021-01-23 NOTE — Patient Instructions (Signed)
For your hypertension, you will continue amlodipine 10 mg, you will increase lisinopril 20 mg once daily.  Continue checking your blood pressure, keeping a written log and having available for all office visits.  Continue low-sodium diet.  For your vitamin D deficiency, you will continue taking vitamin D 50,000 units once a week, this will continue for total of 12 weeks.  Please return to the mobile unit next week for fasting labs, we will help you with the information for [facility] financial assistance and help you obtain a primary care provider at that visit.  Roney Jaffe, PA-C Physician Assistant [facility] [email or web address]   Vitamin D Deficiency Vitamin D deficiency is when your body does not have enough vitamin D. Vitamin D is important to your body for many reasons: It helps the body absorb two important minerals--calcium and phosphorus. It plays a role in bone health. It may help to prevent some diseases, such as diabetes and multiple sclerosis. It plays a role in muscle function, including heart function. If vitamin D deficiency is severe, it can cause a condition in which your bones become soft. In adults, this condition is called osteomalacia. In children,this condition is called rickets. What are the causes? This condition may be caused by: Not eating enough foods that contain vitamin D. Not getting enough natural sun exposure. Having certain digestive system diseases that make it difficult for your body to absorb vitamin D. These diseases include Crohn's disease, chronic pancreatitis, and cystic fibrosis. Having a surgery in which a part of the stomach or a part of the small intestine is removed. Having chronic kidney disease or liver disease. What increases the risk? You are more likely to develop this condition if you: Are older. Do not spend much time outdoors. Live in a long-term care facility. Have had  broken bones. Have weak or thin bones (osteoporosis). Have a disease or condition that changes how the body absorbs vitamin D. Have dark skin. Take certain medicines, such as steroid medicines or certain seizure medicines. Are overweight or obese. What are the signs or symptoms? In mild cases of vitamin D deficiency, there may not be any symptoms. If the condition is severe, symptoms may include: Bone pain. Muscle pain. Falling often. Broken bones caused by a minor injury. How is this diagnosed? This condition may be diagnosed with blood tests. Imaging tests such as [facility] also be done to look for changes in the bone. How is this treated? Treatment for this condition may depend on what caused the condition. Treatment options include: Taking vitamin D supplements. Your health care provider will suggest what dose is best for you. Taking a calcium supplement. Your health care provider will suggest what dose is best for you. Follow these instructions at home: Eating and drinking  Eat foods that contain vitamin D. Choices include: Fortified dairy products, cereals, or juices. Fortified means that vitamin D has been added to the food. Check the label on the package to see if the food is fortified. Fatty fish, such as salmon or trout. Eggs. Oysters. Mushrooms. The items listed above may not be a complete list of recommended foods and beverages. Contact a dietitian for more information. General instructions Take medicines and supplements only as told by your health care provider. Get regular, safe exposure to natural sunlight. Do not use a tanning bed. Maintain a healthy weight. Lose weight if needed. Keep all follow-up visits as told by your health care provider. This is important. How is  this prevented? You can get vitamin D by: Eating foods that naturally contain vitamin D. Eating or drinking products that have been fortified with vitamin D, such as cereals, juices, and dairy  products (including milk). Taking a vitamin D supplement or a multivitamin supplement that contains vitamin D. Being in the sun. Your body naturally makes vitamin D when your skin is exposed to sunlight. Your body changes the sunlight into a form of the vitamin that it can use. Contact a health care provider if: Your symptoms do not go away. You feel nauseous or you vomit. You have fewer bowel movements than usual or are constipated. Summary Vitamin D deficiency is when your body does not have enough vitamin D. Vitamin D is important to your body for good bone health and muscle function, and it may help prevent some diseases. Vitamin D deficiency is primarily treated through supplementation. Your health care provider will suggest what dose is best for you. You can get vitamin D by eating foods that contain vitamin D, by being in the sun, and by taking a vitamin D supplement or a multivitamin supplement that contains vitamin D. This information is not intended to replace advice given to you by your health care provider. Make sure you discuss any questions you have with your healthcare provider. Document Revised: 03/31/2018 Document Reviewed: 03/31/2018 Elsevier Patient Education  2022 [employer].

## 2021-01-24 DIAGNOSIS — N289 Disorder of kidney and ureter, unspecified: Secondary | ICD-10-CM | POA: Insufficient documentation

## 2021-01-24 DIAGNOSIS — F1011 Alcohol abuse, in remission: Secondary | ICD-10-CM | POA: Insufficient documentation

## 2021-01-31 ENCOUNTER — Other Ambulatory Visit: Payer: Self-pay

## 2021-01-31 ENCOUNTER — Ambulatory Visit (HOSPITAL_COMMUNITY): Payer: No Payment, Other | Admitting: Behavioral Health

## 2021-01-31 DIAGNOSIS — F1020 Alcohol dependence, uncomplicated: Secondary | ICD-10-CM

## 2021-02-16 NOTE — Progress Notes (Signed)
Virtual Visit via Telephone Note  I connected with Jaime Brown on 01/31/2021 at  2:00 PM EDT by telephone and verified that I am speaking with the correct person using two identifiers.  Location: Patient: Home - [place],  Provider: Clinical Home - [facility]   I discussed the limitations, risks, security and privacy concerns of performing an evaluation and management service by telephone and the availability of in person appointments. I also discussed with the patient that there may be a patient responsible charge related to this service. The patient expressed understanding and agreed to proceed.  I discussed the assessment and treatment plan with the patient. The patient was provided an opportunity to ask questions and all were answered. The patient agreed with the plan and demonstrated an understanding of the instructions.   The patient was advised to call back or seek an in-person evaluation if the symptoms worsen or if the condition fails to improve as anticipated.  I provided 30 minutes of non-face-to-face time during this encounter.   Mamie Nick, Counselor Jaime Brown is a 50 y.o. male client who presented today for a virtual appointment with this Clinical research associate. Clt reports he was referred for SAIOP treatment after he completed 30 day residential program at [facility]. Clt reports he is unsure of when he can start SAIOP services due to having an upcoming court date - 02/20/2021 (probation).  Clt was instructed to gather more information from his probation officer and then return to [facility] to initiate IOP services - clt in agreement and expressed understanding.        Mamie Nick, Counselor

## 2021-04-13 ENCOUNTER — Other Ambulatory Visit: Payer: Self-pay

## 2021-04-13 ENCOUNTER — Emergency Department (HOSPITAL_COMMUNITY)
Admission: EM | Admit: 2021-04-13 | Discharge: 2021-04-13 | Disposition: A | Discharge status: Home / Self Care | Payer: Self-pay | Attending: Emergency Medicine | Admitting: Emergency Medicine

## 2021-04-13 ENCOUNTER — Encounter (HOSPITAL_COMMUNITY): Payer: Self-pay

## 2021-04-13 DIAGNOSIS — I10 Essential (primary) hypertension: Secondary | ICD-10-CM

## 2021-04-13 DIAGNOSIS — Z79899 Other long term (current) drug therapy: Secondary | ICD-10-CM | POA: Insufficient documentation

## 2021-04-13 DIAGNOSIS — F17210 Nicotine dependence, cigarettes, uncomplicated: Secondary | ICD-10-CM | POA: Insufficient documentation

## 2021-04-13 LAB — CBC WITH DIFFERENTIAL/PLATELET
Abs Immature Granulocytes: 0.01 10*3/uL (ref 0.00–0.07)
Basophils Absolute: 0.1 10*3/uL (ref 0.0–0.1)
Basophils Relative: 2 %
Eosinophils Absolute: 0.1 10*3/uL (ref 0.0–0.5)
Eosinophils Relative: 1 %
HCT: 35.5 % — ABNORMAL LOW (ref 39.0–52.0)
Hemoglobin: 11.7 g/dL — ABNORMAL LOW (ref 13.0–17.0)
Immature Granulocytes: 0 %
Lymphocytes Relative: 44 %
Lymphs Abs: 2.8 10*3/uL (ref 0.7–4.0)
MCH: 28.3 pg (ref 26.0–34.0)
MCHC: 33 g/dL (ref 30.0–36.0)
MCV: 85.7 fL (ref 80.0–100.0)
Monocytes Absolute: 0.5 10*3/uL (ref 0.1–1.0)
Monocytes Relative: 8 %
Neutro Abs: 2.9 10*3/uL (ref 1.7–7.7)
Neutrophils Relative %: 45 %
Platelets: 261 10*3/uL (ref 150–400)
RBC: 4.14 MIL/uL — ABNORMAL LOW (ref 4.22–5.81)
RDW: 13.9 % (ref 11.5–15.5)
WBC: 6.4 10*3/uL (ref 4.0–10.5)
nRBC: 0 % (ref 0.0–0.2)

## 2021-04-13 LAB — BASIC METABOLIC PANEL
Anion gap: 9 (ref 5–15)
BUN: 12 mg/dL (ref 6–20)
CO2: 28 mmol/L (ref 22–32)
Calcium: 8.9 mg/dL (ref 8.9–10.3)
Chloride: 106 mmol/L (ref 98–111)
Creatinine, Ser: 1.66 mg/dL — ABNORMAL HIGH (ref 0.61–1.24)
GFR, Estimated: 50 mL/min — ABNORMAL LOW (ref >60)
Glucose, Bld: 96 mg/dL (ref 70–99)
Potassium: 4.2 mmol/L (ref 3.5–5.1)
Sodium: 143 mmol/L (ref 135–145)

## 2021-04-13 MED ORDER — ACETAMINOPHEN 500 MG PO TABS
1000.0000 mg | ORAL_TABLET | Freq: Once | ORAL | Status: AC
  Administered 2021-04-13: 1000 mg via ORAL
  Filled 2021-04-13: qty 2

## 2021-04-13 MED ORDER — AMLODIPINE BESYLATE 10 MG PO TABS: 10.0000 mg | ORAL_TABLET | Freq: Every day | ORAL | 2 refills | Status: DC

## 2021-04-13 MED ORDER — AMLODIPINE BESYLATE 5 MG PO TABS
10.0000 mg | ORAL_TABLET | Freq: Once | ORAL | Status: AC
  Administered 2021-04-13: 10 mg via ORAL
  Filled 2021-04-13: qty 2

## 2021-04-13 MED ORDER — LISINOPRIL 20 MG PO TABS: 20.0000 mg | ORAL_TABLET | Freq: Every day | ORAL | 2 refills | Status: DC

## 2021-04-13 MED ORDER — LISINOPRIL 20 MG PO TABS
20.0000 mg | ORAL_TABLET | Freq: Once | ORAL | Status: AC
  Administered 2021-04-13: 20 mg via ORAL
  Filled 2021-04-13: qty 1

## 2021-04-13 NOTE — ED Provider Notes (Signed)
[facility]-EMERGENCY DEPT Provider Note   CSN: [id number] Arrival date & time: 04/13/21  6734     History Chief Complaint  Patient presents with   Headache    Jaime Brown is a 50 y.o. male.  Patient complains of a headache.  He has been out of his blood pressure medicine for a short while.  The history is provided by the patient and medical records. No language interpreter was used.  Headache Pain location:  Generalized Quality:  Dull Radiates to:  Does not radiate Severity currently:  4/10 Severity at highest:  7/10 Onset quality:  Sudden Timing:  Constant Progression:  Waxing and waning Chronicity:  New Similar to prior headaches: no   Context: not activity   Associated symptoms: no abdominal pain, no back pain, no congestion, no cough, no diarrhea, no fatigue, no seizures and no sinus pressure       Past Medical History:  Diagnosis Date   Hypertension     Patient Active Problem List   Diagnosis Date Noted   Abnormal kidney function 01/24/2021   Alcohol abuse, in remission 01/24/2021   Essential hypertension 01/10/2021   Vitamin D deficiency 01/10/2021    History reviewed. No pertinent surgical history.     Family History  Problem Relation Age of Onset   Hypertension Mother    Hypertension Father     Social History   Tobacco Use   Smoking status: Every Day    Packs/day: 0.50    Types: Cigarettes   Smokeless tobacco: Never  Vaping Use   Vaping Use: Never used  Substance Use Topics   Alcohol use: Yes    Comment: social   Drug use: No    Home Medications Prior to Admission medications   Medication Sig Start Date End Date Taking? Authorizing Provider  amLODipine (NORVASC) 10 MG tablet Take 1 tablet (10 mg total) by mouth daily. 01/10/21 03/11/21  Mayers, Cari S, PA-C  lisinopril (ZESTRIL) 20 MG tablet Take 1 tablet (20 mg total) by mouth daily. 01/23/21   Mayers, Cari S, PA-C  Vitamin D, Ergocalciferol, (DRISDOL) 1.25 MG  (50000 UNIT) CAPS capsule Take 1 capsule (50,000 Units total) by mouth every 7 (seven) days. 01/11/21   Mayers, Cari S, PA-C    Allergies    Patient has no known allergies.  Review of Systems   Review of Systems  Constitutional:  Negative for appetite change and fatigue.  HENT:  Negative for congestion, ear discharge and sinus pressure.   Eyes:  Negative for discharge.  Respiratory:  Negative for cough.   Cardiovascular:  Negative for chest pain.  Gastrointestinal:  Negative for abdominal pain and diarrhea.  Genitourinary:  Negative for frequency and hematuria.  Musculoskeletal:  Negative for back pain.  Skin:  Negative for rash.  Neurological:  Positive for headaches. Negative for seizures.  Psychiatric/Behavioral:  Negative for hallucinations.    Physical Exam Updated Vital Signs BP (!) 144/90 (BP Location: Left Arm)   Pulse 90   Temp 98 F (36.7 C) (Oral)   Resp 16   Ht 5\' 10"  (1.778 m)   Wt 81 kg   SpO2 96%   BMI 25.62 kg/m   Physical Exam Vitals and nursing note reviewed.  Constitutional:      Appearance: He is well-developed.  HENT:     Head: Normocephalic.     Nose: Nose normal.  Eyes:     General: No scleral icterus.    Conjunctiva/sclera: Conjunctivae normal.  Neck:  Thyroid: No thyromegaly.  Cardiovascular:     Rate and Rhythm: Normal rate and regular rhythm.     Heart sounds: No murmur heard.   No friction rub. No gallop.  Pulmonary:     Breath sounds: No stridor. No wheezing or rales.  Chest:     Chest wall: No tenderness.  Abdominal:     General: There is no distension.     Tenderness: There is no abdominal tenderness. There is no rebound.  Musculoskeletal:        General: Normal range of motion.     Cervical back: Neck supple.  Lymphadenopathy:     Cervical: No cervical adenopathy.  Skin:    Findings: No erythema or rash.  Neurological:     Mental Status: He is alert and oriented to person, place, and time.     Motor: No abnormal muscle  tone.     Coordination: Coordination normal.  Psychiatric:        Behavior: Behavior normal.    ED Results / Procedures / Treatments   Labs (all labs ordered are listed, but only abnormal results are displayed) Labs Reviewed  BASIC METABOLIC PANEL - Abnormal; Notable for the following components:      Result Value   Creatinine, Ser 1.66 (*)    GFR, Estimated 50 (*)    All other components within normal limits  CBC WITH DIFFERENTIAL/PLATELET - Abnormal; Notable for the following components:   RBC 4.14 (*)    Hemoglobin 11.7 (*)    HCT 35.5 (*)    All other components within normal limits    EKG None  Radiology No results found.  Procedures Procedures   Medications Ordered in ED Medications  amLODipine (NORVASC) tablet 10 mg (has no administration in time range)  lisinopril (ZESTRIL) tablet 20 mg (has no administration in time range)  acetaminophen (TYLENOL) tablet 1,000 mg (has no administration in time range)    ED Course  I have reviewed the triage vital signs and the nursing notes.  Pertinent labs & imaging results that were available during my care of the patient were reviewed by me and considered in my medical decision making (see chart for details).    MDM Rules/Calculators/A&P                           Patient out of his blood pressure medicine.  Patient complains of a headache.  Labs show minor elevation in creatinine.  This is been stable since last year.  Patient is placed back on his lisinopril and his Norvasc and given Tylenol for his headache.  He is told to follow-up with family doctor to follow his blood pressure and his kidney function Final Clinical Impression(s) / ED Diagnoses Final diagnoses:  None    Rx / DC Orders ED Discharge Orders     None        Bethann Berkshire, MD 04/13/21 1005

## 2021-04-13 NOTE — ED Notes (Signed)
Pt currently in lobby restroom.

## 2021-04-13 NOTE — ED Notes (Signed)
Pt ambulatory in ED lobby. 

## 2021-04-13 NOTE — ED Triage Notes (Signed)
Patient reports being out of BP meds x2weeks, headache since yesterday. Pt states "I blacked out" when asked if he fell patients states no I was getting ready for work and was putting on my cloths and just stood there for a min without knowing my surroundings".

## 2021-04-13 NOTE — Discharge Instructions (Addendum)
Follow-up with your family doctor in the next 3 to 4 weeks to see how your blood pressure is doing and to follow your kidney function

## 2021-07-05 ENCOUNTER — Emergency Department (HOSPITAL_COMMUNITY): Payer: No Typology Code available for payment source

## 2021-07-05 ENCOUNTER — Emergency Department (HOSPITAL_COMMUNITY)
Admission: EM | Admit: 2021-07-05 | Discharge: 2021-07-05 | Disposition: A | Discharge status: Home / Self Care | Payer: No Typology Code available for payment source | Attending: Emergency Medicine | Admitting: Emergency Medicine

## 2021-07-05 ENCOUNTER — Encounter (HOSPITAL_COMMUNITY): Payer: Self-pay

## 2021-07-05 ENCOUNTER — Other Ambulatory Visit: Payer: Self-pay

## 2021-07-05 DIAGNOSIS — Y92410 Unspecified street and highway as the place of occurrence of the external cause: Secondary | ICD-10-CM | POA: Diagnosis not present

## 2021-07-05 DIAGNOSIS — F17210 Nicotine dependence, cigarettes, uncomplicated: Secondary | ICD-10-CM | POA: Insufficient documentation

## 2021-07-05 DIAGNOSIS — M545 Low back pain, unspecified: Secondary | ICD-10-CM | POA: Diagnosis not present

## 2021-07-05 DIAGNOSIS — Z79899 Other long term (current) drug therapy: Secondary | ICD-10-CM | POA: Insufficient documentation

## 2021-07-05 DIAGNOSIS — I10 Essential (primary) hypertension: Secondary | ICD-10-CM | POA: Insufficient documentation

## 2021-07-05 DIAGNOSIS — M542 Cervicalgia: Secondary | ICD-10-CM | POA: Insufficient documentation

## 2021-07-05 MED ORDER — NAPROXEN 500 MG PO TABS: 500.0000 mg | ORAL_TABLET | Freq: Two times a day (BID) | ORAL | 0 refills | Status: AC

## 2021-07-05 MED ORDER — LIDOCAINE 5 % EX PTCH: 1.0000 | MEDICATED_PATCH | CUTANEOUS | 0 refills | Status: AC

## 2021-07-05 MED ORDER — METHOCARBAMOL 500 MG PO TABS: 500.0000 mg | ORAL_TABLET | Freq: Two times a day (BID) | ORAL | 0 refills | Status: AC
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
## 2021-07-05 NOTE — ED Notes (Signed)
An After Visit Summary was printed and given to the patient. Discharge instructions given and no further questions at this time.  

## 2021-07-05 NOTE — Discharge Instructions (Addendum)
You may utilize ibuprofen, Tylenol or naproxen for pain relief.  Do not take ibuprofen and naproxen at the same time.  Please take these medications with food because they may harm your stomach.  I have sent lidocaine patches to the pharmacy.  You may also buy these over-the-counter.  It is important that you only wear them for 12 hours at a time.  You must have 12 hours without a patch each day.  Do not drive on the muscle relaxants as they may make you sleepy.

## 2022-02-18 ENCOUNTER — Emergency Department (HOSPITAL_BASED_OUTPATIENT_CLINIC_OR_DEPARTMENT_OTHER)
Admission: EM | Admit: 2022-02-18 | Discharge: 2022-02-18 | Disposition: A | Discharge status: Home / Self Care | Payer: 59 | Attending: Emergency Medicine | Admitting: Emergency Medicine

## 2022-02-18 ENCOUNTER — Encounter (HOSPITAL_BASED_OUTPATIENT_CLINIC_OR_DEPARTMENT_OTHER): Payer: Self-pay | Admitting: Emergency Medicine

## 2022-02-18 ENCOUNTER — Other Ambulatory Visit: Payer: Self-pay

## 2022-02-18 DIAGNOSIS — I10 Essential (primary) hypertension: Secondary | ICD-10-CM

## 2022-02-18 DIAGNOSIS — L03012 Cellulitis of left finger: Secondary | ICD-10-CM | POA: Diagnosis not present

## 2022-02-18 DIAGNOSIS — Z79899 Other long term (current) drug therapy: Secondary | ICD-10-CM | POA: Diagnosis not present

## 2022-02-18 DIAGNOSIS — R2232 Localized swelling, mass and lump, left upper limb: Secondary | ICD-10-CM | POA: Diagnosis present

## 2022-02-18 MED ORDER — LISINOPRIL 20 MG PO TABS: 20.0000 mg | ORAL_TABLET | Freq: Every day | ORAL | 2 refills | Status: DC

## 2022-02-18 MED ORDER — AMLODIPINE BESYLATE 10 MG PO TABS: 10.0000 mg | ORAL_TABLET | Freq: Every day | ORAL | 2 refills | Status: DC

## 2022-02-18 MED ORDER — LIDOCAINE HCL (PF) 1 % IJ SOLN
5.0000 mL | Freq: Once | INTRAMUSCULAR | Status: AC
  Administered 2022-02-18: 5 mL
  Filled 2022-02-18: qty 5

## 2022-02-18 NOTE — Discharge Instructions (Signed)
You were seen for a left index fingertip infection.  The area was incised and drained pus.  Please do warm water soaks some salt water 3-4 times a day.  Tylenol for pain.  Your blood pressure was also elevated here and we are restarting you on your blood pressure medications.  Please follow-up with your primary care doctor for reassessment of your blood pressure.  Return if any worsening or concerning symptoms

## 2022-02-18 NOTE — ED Provider Notes (Signed)
[facility] EMERGENCY DEPARTMENT Provider Note   CSN: [id number] Arrival date & time: 02/18/22  1101     History  Chief Complaint  Patient presents with   Abscess    Jaime Brown is a 51 y.o. male.  He has a history of hypertension and has been out of his medications for over a week.  He is here with a complaint of swelling and pain to his left index finger.  No known trauma.  No fevers or chills.  No other complaints.  The history is provided by the patient.  Abscess Location:  Finger Finger abscess location:  L index finger Abscess quality: fluctuance and painful   Abscess quality: not draining   Progression:  Worsening Pain details:    Quality:  Dull   Timing:  Constant   Progression:  Unchanged Chronicity:  New Context: not diabetes   Relieved by:  None tried Worsened by:  Nothing Ineffective treatments:  None tried Associated symptoms: no fever        Home Medications Prior to Admission medications   Medication Sig Start Date End Date Taking? Authorizing Provider  amLODipine (NORVASC) 10 MG tablet Take 1 tablet (10 mg total) by mouth daily. 04/13/21   Bethann Berkshire, MD  lidocaine (LIDODERM) 5 % Place 1 patch onto the skin daily. Remove & Discard patch within 12 hours or as directed by MD 07/05/21   Redwine, Madison A, PA-C  lisinopril (ZESTRIL) 20 MG tablet Take 1 tablet (20 mg total) by mouth daily. 04/13/21   Bethann Berkshire, MD  methocarbamol (ROBAXIN) 500 MG tablet Take 1 tablet (500 mg total) by mouth 2 (two) times daily. 07/05/21   Redwine, Madison A, PA-C  naproxen (NAPROSYN) 500 MG tablet Take 1 tablet (500 mg total) by mouth 2 (two) times daily. 07/05/21   Redwine, Madison A, PA-C  Vitamin D, Ergocalciferol, (DRISDOL) 1.25 MG (50000 UNIT) CAPS capsule Take 1 capsule (50,000 Units total) by mouth every 7 (seven) days. 01/11/21   Mayers, Cari S, PA-C      Allergies    Patient has no known allergies.    Review of Systems   Review of Systems   Constitutional:  Negative for fever.  Respiratory:  Negative for shortness of breath.   Cardiovascular:  Negative for chest pain.  Skin:  Negative for wound.    Physical Exam Updated Vital Signs BP (!) 182/114 (BP Location: Right Arm)   Pulse 72   Temp 98 F (36.7 C) (Oral)   Resp 17   Ht 5\' 10"  (1.778 m)   Wt 70.3 kg   SpO2 100%   BMI 22.24 kg/m  Physical Exam Vitals and nursing note reviewed.  Constitutional:      General: He is not in acute distress.    Appearance: Normal appearance. He is well-developed.  HENT:     Head: Normocephalic and atraumatic.  Eyes:     Conjunctiva/sclera: Conjunctivae normal.  Cardiovascular:     Rate and Rhythm: Normal rate and regular rhythm.     Heart sounds: No murmur heard. Pulmonary:     Effort: Pulmonary effort is normal. No respiratory distress.     Breath sounds: Normal breath sounds.  Abdominal:     Palpations: Abdomen is soft.     Tenderness: There is no abdominal tenderness.  Musculoskeletal:        General: Tenderness present. No swelling. Normal range of motion.     Cervical back: Neck supple.     Comments: Left  hand index finger has a paronychia at the nail fold.  There are some fluctuance and tenderness.  There is no proximal streaking.  Cap refill brisk.  No open wounds.  Full range of motion.  Skin:    General: Skin is warm and dry.     Capillary Refill: Capillary refill takes less than 2 seconds.  Neurological:     General: No focal deficit present.     Mental Status: He is alert.     Sensory: No sensory deficit.     Motor: No weakness.     ED Results / Procedures / Treatments   Labs (all labs ordered are listed, but only abnormal results are displayed) Labs Reviewed - No data to display  EKG EKG Interpretation  Date/Time:  Sunday February 18 2022 11:21:31 EDT Ventricular Rate:  66 PR Interval:  180 QRS Duration: 92 QT Interval:  396 QTC Calculation: 415 R Axis:   68 Text Interpretation: Normal sinus  rhythm Minimal voltage criteria for LVH, may be normal variant ( Sokolow-Lyon ) ST elevation, consider early repolarization Borderline ECG When compared with ECG of 23-Sep-2020 06:49, No significant change since last tracing Confirmed by Meridee Score [phone]) on 02/18/2022 11:29:33 AM  Radiology No results found.  Procedures .Marland KitchenIncision and Drainage  Date/Time: 02/18/2022 1:40 PM  Performed by: Terrilee Files, MD Authorized by: Terrilee Files, MD   Consent:    Consent obtained:  Verbal   Consent given by:  Patient   Risks discussed:  Bleeding, incomplete drainage, pain and infection   Alternatives discussed:  No treatment, delayed treatment and referral Universal protocol:    Procedure explained and questions answered to patient or proxy's satisfaction: yes     Patient identity confirmed:  Verbally with patient Location:    Type:  Abscess   Size:  1   Location:  Upper extremity   Upper extremity location:  Finger   Finger location:  L index finger Pre-procedure details:    Skin preparation:  Povidone-iodine Anesthesia:    Anesthesia method:  Nerve block   Block anesthetic:  Lidocaine 1% w/o epi   Block technique:  Digital   Block outcome:  Anesthesia achieved Procedure type:    Complexity:  Complex Procedure details:    Wound management:  Probed and deloculated   Drainage:  Purulent   Drainage amount:  Moderate Post-procedure details:    Procedure completion:  Tolerated well, no immediate complications     Medications Ordered in ED Medications  lidocaine (PF) (XYLOCAINE) 1 % injection 5 mL (has no administration in time range)    ED Course/ Medical Decision Making/ A&P                           Medical Decision Making Risk Prescription drug management.  51 year old male with left index finger infection.  He has a history of hypertension.  Has not taken his medicines in over a week with accelerated blood pressure here.  He is otherwise asymptomatic from  that standpoint.  Finger I&D needed with expression of pus.  We will restart on his blood pressure medications and recommended close follow-up with PCP.  Return instructions discussed        Final Clinical Impression(s) / ED Diagnoses Final diagnoses:  Paronychia of finger of left hand  Primary hypertension    Rx / DC Orders ED Discharge Orders          Ordered    amLODipine (  NORVASC) 10 MG tablet  Daily        02/18/22 1257    lisinopril (ZESTRIL) 20 MG tablet  Daily        02/18/22 1257              Terrilee Files, MD 02/18/22 1830

## 2022-02-18 NOTE — ED Triage Notes (Signed)
Pt arrives pov, steady gait to triage, endorses abscess on left index finger,  possible injury x 4 days pta. Also reports being out of htn meds

## 2022-08-01 ENCOUNTER — Emergency Department (HOSPITAL_BASED_OUTPATIENT_CLINIC_OR_DEPARTMENT_OTHER)
Admission: EM | Admit: 2022-08-01 | Discharge: 2022-08-02 | Disposition: A | Discharge status: Home / Self Care | Payer: No Typology Code available for payment source | Attending: Emergency Medicine | Admitting: Emergency Medicine

## 2022-08-01 ENCOUNTER — Other Ambulatory Visit: Payer: Self-pay

## 2022-08-01 DIAGNOSIS — I10 Essential (primary) hypertension: Secondary | ICD-10-CM | POA: Insufficient documentation

## 2022-08-01 DIAGNOSIS — R519 Headache, unspecified: Secondary | ICD-10-CM | POA: Diagnosis present

## 2022-08-01 NOTE — ED Triage Notes (Signed)
Pt ran out of BP meds 2 weeks ago and has been having headaches. Denes n/v

## 2022-08-02 MED ORDER — AMLODIPINE BESYLATE 10 MG PO TABS: 10.0000 mg | ORAL_TABLET | Freq: Every day | ORAL | 0 refills | Status: DC

## 2022-08-02 MED ORDER — KETOROLAC TROMETHAMINE 15 MG/ML IJ SOLN
15.0000 mg | Freq: Once | INTRAMUSCULAR | Status: AC
  Administered 2022-08-02: 15 mg via INTRAMUSCULAR
  Filled 2022-08-02: qty 1

## 2022-08-02 MED ORDER — LISINOPRIL 20 MG PO TABS: 20.0000 mg | ORAL_TABLET | Freq: Every day | ORAL | 0 refills | Status: DC

## 2022-08-02 NOTE — ED Notes (Signed)
D/c paperwork reviewed with pt, including prescriptions and f/u care. Pt verbalized understanding, no questions or concerns at time of d/c. Ambulatory to ED exit without assistance.  

## 2022-08-02 NOTE — Discharge Instructions (Signed)

## 2022-08-07 NOTE — ED Provider Notes (Signed)
Emergency Department Provider Note   I have reviewed the triage vital signs and the nursing notes.   HISTORY  Chief Complaint Hypertension and Headache   HPI Jaime Brown is a 52 y.o. male presents emergency department for evaluation of headaches intermittently over the past 2 weeks in the setting of running out of his blood pressure medicines.  He is on 2 medications and usually compliant but is run out of them.  No sudden onset, maximal intensity headache.  No nausea or vomiting.  No confusion.  No fevers or chills.  No upper respiratory infection symptoms.   Past Medical History:  Diagnosis Date   Hypertension     Review of Systems  Constitutional: No fever/chills Cardiovascular: Denies chest pain. Respiratory: Denies shortness of breath. Gastrointestinal: No abdominal pain.  No nausea, no vomiting.  No diarrhea.  No constipation. Genitourinary: Negative for dysuria. Musculoskeletal: Negative for back pain. Skin: Negative for rash. Neurological: Positive HA.   ____________________________________________   PHYSICAL EXAM:  VITAL SIGNS: ED Triage Vitals  Enc Vitals Group     BP 08/01/22 2028 (!) 172/107     Pulse Rate 08/01/22 2028 86     Resp 08/01/22 2028 18     Temp 08/01/22 2028 97.8 F (36.6 C)     Temp src --      SpO2 08/01/22 2028 100 %     Weight 08/01/22 2026 155 lb (70.3 kg)     Height 08/01/22 2026 5\' 10"  (1.778 m)    Constitutional: Alert and oriented. Well appearing and in no acute distress. Eyes: Conjunctivae are normal. PERRL. EOMI. Head: Atraumatic. Nose: No congestion/rhinnorhea. Mouth/Throat: Mucous membranes are moist.   Neck: No stridor.   Cardiovascular: Normal rate, regular rhythm. Good peripheral circulation. Grossly normal heart sounds.   Respiratory: Normal respiratory effort.  No retractions. Lungs CTAB. Gastrointestinal: Soft and nontender. No distention.  Musculoskeletal: No lower extremity tenderness nor edema. No gross  deformities of extremities. Neurologic:  Normal speech and language. No gross focal neurologic deficits are appreciated.  Skin:  Skin is warm, dry and intact. No rash noted.   ____________________________________________   PROCEDURES  Procedure(s) performed:   Procedures  None  ____________________________________________   INITIAL IMPRESSION / ASSESSMENT AND PLAN / ED COURSE  Pertinent labs & imaging results that were available during my care of the patient were reviewed by me and considered in my medical decision making (see chart for details).   This patient is Presenting for Evaluation of HA, which does require a range of treatment options, and is a complaint that involves a high risk of morbidity and mortality.  The Differential Diagnoses includes but is not exclusive to subarachnoid hemorrhage, meningitis, encephalitis, previous head trauma, cavernous venous thrombosis, muscle tension headache, glaucoma, temporal arteritis, migraine or migraine equivalent, etc.   Critical Interventions-    Medications  ketorolac (TORADOL) 15 MG/ML injection 15 mg (15 mg Intramuscular Given 08/02/22 0026)    Reassessment after intervention: Symptoms improved.    Medical Decision Making: Summary:  Presents emergency department headache symptoms and elevated blood pressure in the setting of being out of his medications.  Plan to restart these.  He had some intermittent mild headache but no findings on exam or history to strongly suspect acute hypertensive emergency.  Do not see an indication for emergent neuroimaging or labs at this time.  Plan to restart his home medications and have him follow closely with his primary care physician.   Patient's presentation is most consistent  with acute illness / injury with system symptoms.   Disposition: discharge  ____________________________________________  FINAL CLINICAL IMPRESSION(S) / ED DIAGNOSES  Final diagnoses:  Hypertension,  unspecified type    Note:  This document was prepared using Dragon voice recognition software and may include unintentional dictation errors.  Nanda Quinton, MD, [facility]    Mysha Peeler, W[facility], MD 08/07/22 [phone]

## 2022-11-19 ENCOUNTER — Encounter: Payer: Self-pay | Admitting: *Deleted

## 2023-04-19 ENCOUNTER — Other Ambulatory Visit: Payer: Self-pay

## 2023-04-19 ENCOUNTER — Emergency Department (HOSPITAL_BASED_OUTPATIENT_CLINIC_OR_DEPARTMENT_OTHER)
Admission: EM | Admit: 2023-04-19 | Discharge: 2023-04-19 | Disposition: A | Discharge status: Home / Self Care | Payer: No Typology Code available for payment source | Attending: Emergency Medicine | Admitting: Emergency Medicine

## 2023-04-19 DIAGNOSIS — S058X1A Other injuries of right eye and orbit, initial encounter: Secondary | ICD-10-CM | POA: Diagnosis present

## 2023-04-19 DIAGNOSIS — W448XXA Other foreign body entering into or through a natural orifice, initial encounter: Secondary | ICD-10-CM | POA: Insufficient documentation

## 2023-04-19 DIAGNOSIS — T1501XA Foreign body in cornea, right eye, initial encounter: Secondary | ICD-10-CM | POA: Diagnosis not present

## 2023-04-19 DIAGNOSIS — Z79899 Other long term (current) drug therapy: Secondary | ICD-10-CM | POA: Insufficient documentation

## 2023-04-19 DIAGNOSIS — Z23 Encounter for immunization: Secondary | ICD-10-CM | POA: Diagnosis not present

## 2023-04-19 DIAGNOSIS — I10 Essential (primary) hypertension: Secondary | ICD-10-CM | POA: Diagnosis not present

## 2023-04-19 MED ORDER — ERYTHROMYCIN 5 MG/GM OP OINT: TOPICAL_OINTMENT | OPHTHALMIC | 0 refills | Status: DC

## 2023-04-19 MED ORDER — ERYTHROMYCIN 5 MG/GM OP OINT: TOPICAL_OINTMENT | OPHTHALMIC | 0 refills | Status: AC

## 2023-04-19 MED ORDER — FLUORESCEIN SODIUM 1 MG OP STRP
1.0000 | ORAL_STRIP | Freq: Once | OPHTHALMIC | Status: AC
  Administered 2023-04-19: 1 via OPHTHALMIC
  Filled 2023-04-19: qty 1

## 2023-04-19 MED ORDER — TETRACAINE HCL 0.5 % OP SOLN
2.0000 [drp] | Freq: Once | OPHTHALMIC | Status: AC
  Administered 2023-04-19: 2 [drp] via OPHTHALMIC
  Filled 2023-04-19: qty 4

## 2023-04-19 MED ORDER — TETANUS-DIPHTH-ACELL PERTUSSIS 5-2.5-18.5 LF-MCG/0.5 IM SUSY
0.5000 mL | PREFILLED_SYRINGE | Freq: Once | INTRAMUSCULAR | Status: AC
  Administered 2023-04-19: 0.5 mL via INTRAMUSCULAR
  Filled 2023-04-19: qty 0.5

## 2023-04-19 NOTE — Discharge Instructions (Addendum)
Please pick up the antibiotic ointment I have prescribed for you.  Today your tetanus was updated however the foreign body was unable to be removed from your eye today.  You may follow-up with the eye doctor at Monday at 8 AM.  I have also attached a work note for you as well.  You may use Tylenol every 6 hours needed for pain.  If symptoms change or worsen please return to ER.

## 2023-04-19 NOTE — ED Triage Notes (Signed)
Patient presents to ED via POV from home. Here with right eye pain/irritation that began on Wednesday. Reports foreign body sensation.

## 2023-04-19 NOTE — ED Provider Notes (Signed)
[place] EMERGENCY DEPARTMENT AT [facility] Provider Note   CSN: [id number] Arrival date & time: 04/19/23  1449     History  Chief Complaint  Patient presents with   Eye Problem    Jaime Brown is a 52 y.o. male history of hypertension status post right eye surgery due to foreign body presented with right eye irritation since Wednesday.  Patient works as Psychologist, occupational and states that on Wednesday he was working when he noticed an irritation to his right eye.  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  Patient unsure if he has "flash burn "to his eye due to the welding.  Patient notes that his vision becomes blurry every now and then but states that he can still see overall.  Patient has not seen an eye doctor in years.  Patient notes that his eyes to tear up every now and again.  Patient denied fevers, facial swelling, purulent discharge, photophobia, flashing lights  Home Medications Prior to Admission medications   Medication Sig Start Date End Date Taking? Authorizing Provider  erythromycin ophthalmic ointment Place a 1/2 inch ribbon of ointment into the lower eyelid. 04/19/23  Yes Evlyn Kanner T, PA-C  amLODipine (NORVASC) 10 MG tablet Take 1 tablet (10 mg total) by mouth daily. 08/02/22   Long, Arlyss Repress, MD  lidocaine (LIDODERM) 5 % Place 1 patch onto the skin daily. Remove & Discard patch within 12 hours or as directed by MD 07/05/21   Redwine, Madison A, PA-C  lisinopril (ZESTRIL) 20 MG tablet Take 1 tablet (20 mg total) by mouth daily. 08/02/22   Long, Arlyss Repress, MD  methocarbamol (ROBAXIN) 500 MG tablet Take 1 tablet (500 mg total) by mouth 2 (two) times daily. 07/05/21   Redwine, Madison A, PA-C  naproxen (NAPROSYN) 500 MG tablet Take 1 tablet (500 mg total) by mouth 2 (two) times daily. 07/05/21   Redwine, Madison A, PA-C  Vitamin D, Ergocalciferol, (DRISDOL) 1.25 MG (50000 UNIT) CAPS capsule Take 1 capsule (50,000  Units total) by mouth every 7 (seven) days. 01/11/21   Mayers, Cari S, PA-C      Allergies    Patient has no known allergies.    Review of Systems   Review of Systems  Physical Exam Updated Vital Signs BP 130/80 (BP Location: Right Arm)   Pulse 75   Temp 98 F (36.7 C) (Oral)   Resp 18   SpO2 98%  Physical Exam Constitutional:      General: He is not in acute distress. Eyes:     General: Vision grossly intact.        Right eye: No foreign body, discharge or hordeolum.     Extraocular Movements: Extraocular movements intact.     Conjunctiva/sclera:     Right eye: Right conjunctiva is injected.     Comments: Visual fields intact With slit limp metallic foreign body was noted over the cornea  Neurological:     Mental Status: He is alert.     ED Results / Procedures / Treatments   Labs (all labs ordered are listed, but only abnormal results are displayed) Labs Reviewed - No data to display  EKG None  Radiology No results found.  Procedures Procedures    Medications Ordered in ED Medications  tetracaine (PONTOCAINE) 0.5 % ophthalmic solution 2 drop (2 drops Left Eye Given 04/19/23 1800)  fluorescein ophthalmic strip 1 strip (1 strip Left Eye Given 04/19/23 1759)  Tdap (BOOSTRIX) injection 0.5 mL (0.5 mLs Intramuscular Given 04/19/23 1938)    ED Course/ Medical Decision Making/ A&P                                 Medical Decision Making Risk Prescription drug management.   [name] 52 y.o. presented today for eye pain.  Working DDx that I considered at this time includes, but not limited to, preorbital/orbital cellulitis, acute glaucoma, HSV infection, open globe, conjunctivitis, hordeolum/chalazion, FB, CRAO/CRVO.  R/o DDx: preorbital/orbital cellulitis, acute glaucoma, HSV infection, open globe, conjunctivitis, hordeolum/chalazion, CRAO/CRVO: These are considered less likely due to history of present illness, physical exam, lab/imaging  findings.  Review of prior external notes: 03/04/2023 ED  Unique Tests and My Interpretation:  Visual Acuity: Bilateral: 20/40, right eye: Everything is blurry, left eye: 20/20 Fluoroscein Stain: No fluorescein reuptake  Discussion with Independent Historian: None  Discussion of Management of Tests: None  Risk: Medium: prescription drug management  Risk Stratification Score: None  Staffed with Jearld Fenton, MD  Plan: On exam patient was in no acute distress but did noted to have injected conjunctiva and right eye.  Patient's vision was grossly intact along with visual fields.  Pupils PERRL bilaterally without pupillary defect noted.  Fluorescein stain did not show any reuptake.  Slit-lamp exam shows metallic foreign body over the cornea.  Ophthalmology will be consulted to see if patient get quicker follow-up as it is Friday night and to see if they want [place] to remove foreign body here or if they want to do that in the office.  The attending and I both tried to remove the metallic foreign body however were unsuccessful and after consulting the ophthalmologist again she recommend the patient either go to [facility] if he wants it out immediately or can be placed on topical antibiotics and follow-up in the office at 8 AM.  I spoke to the patient and after extensive conversation patient elected to follow-up in the office outpatient.  Patient was given very strict return precautions and encouraged to pick up the antibiotic ointment I prescribed for him.  Patient does understand the risks of having the foreign body in his eye and is aware of the signs and symptoms to look out for that would require him to return to the ER.  Patient was given return precautions. Patient stable for discharge at this time.  Patient verbalized understanding of plan.         Final Clinical Impression(s) / ED Diagnoses Final diagnoses:  Rust ring of right cornea due to metallic foreign body    Rx / DC Orders ED  Discharge Orders          Ordered    erythromycin ophthalmic ointment        04/19/23 1959              Netta Corrigan, PA-C 04/19/23 2105    Loetta Rough, MD 04/23/23 1248

## 2023-04-23 NOTE — ED Provider Notes (Signed)
.  Foreign Body Removal  Date/Time: 04/23/2023 12:44 PM  Performed by: Loetta Rough, MD Authorized by: Loetta Rough, MD  Consent: Verbal consent obtained. Risks and benefits: risks, benefits and alternatives were discussed Consent given by: patient Required items: required blood products, implants, devices, and special equipment available Patient identity confirmed: verbally with patient Time out: Immediately prior to procedure a "time out" was called to verify the correct patient, procedure, equipment, support staff and site/side marked as required. Body area: eye Location details: right cornea  Anesthesia: Local Anesthetic: tetracaine drops  Sedation: Patient sedated: no  Patient restrained: no Patient cooperative: yes Localization method: slit lamp Removal mechanism: 25-gauge needle Eye examined with fluorescein. No fluorescein uptake. No residual rust ring present. Dressing: antibiotic ointment Depth: embedded Post-procedure assessment: residual foreign bodies remain Patient tolerance: patient tolerated the procedure well with no immediate complications Comments: Metal unable to be removed from cornea      Loetta Rough, MD 04/23/23 1245

## 2024-08-13 ENCOUNTER — Ambulatory Visit: Payer: Self-pay

## 2024-08-13 ENCOUNTER — Emergency Department (HOSPITAL_BASED_OUTPATIENT_CLINIC_OR_DEPARTMENT_OTHER)
Admission: EM | Admit: 2024-08-13 | Discharge: 2024-08-13 | Disposition: A | Discharge status: Home / Self Care | Attending: Emergency Medicine | Admitting: Emergency Medicine

## 2024-08-13 ENCOUNTER — Emergency Department (HOSPITAL_BASED_OUTPATIENT_CLINIC_OR_DEPARTMENT_OTHER)

## 2024-08-13 ENCOUNTER — Encounter (HOSPITAL_BASED_OUTPATIENT_CLINIC_OR_DEPARTMENT_OTHER): Payer: Self-pay | Admitting: Emergency Medicine

## 2024-08-13 ENCOUNTER — Other Ambulatory Visit: Payer: Self-pay

## 2024-08-13 DIAGNOSIS — I10 Essential (primary) hypertension: Secondary | ICD-10-CM | POA: Diagnosis not present

## 2024-08-13 DIAGNOSIS — F17200 Nicotine dependence, unspecified, uncomplicated: Secondary | ICD-10-CM | POA: Diagnosis not present

## 2024-08-13 DIAGNOSIS — Z79899 Other long term (current) drug therapy: Secondary | ICD-10-CM | POA: Insufficient documentation

## 2024-08-13 DIAGNOSIS — R253 Fasciculation: Secondary | ICD-10-CM | POA: Insufficient documentation

## 2024-08-13 DIAGNOSIS — R7989 Other specified abnormal findings of blood chemistry: Secondary | ICD-10-CM | POA: Insufficient documentation

## 2024-08-13 LAB — BASIC METABOLIC PANEL WITH GFR
Anion gap: 12 (ref 5–15)
BUN: 28 mg/dL — ABNORMAL HIGH (ref 6–20)
CO2: 23 mmol/L (ref 22–32)
Calcium: 9.5 mg/dL (ref 8.9–10.3)
Chloride: 103 mmol/L (ref 98–111)
Creatinine, Ser: 1.87 mg/dL — ABNORMAL HIGH (ref 0.61–1.24)
GFR, Estimated: 43 mL/min — ABNORMAL LOW
Glucose, Bld: 103 mg/dL — ABNORMAL HIGH (ref 70–99)
Potassium: 4.3 mmol/L (ref 3.5–5.1)
Sodium: 138 mmol/L (ref 135–145)

## 2024-08-13 LAB — CBC
HCT: 41.9 % (ref 39.0–52.0)
Hemoglobin: 14.2 g/dL (ref 13.0–17.0)
MCH: 28.7 pg (ref 26.0–34.0)
MCHC: 33.9 g/dL (ref 30.0–36.0)
MCV: 84.8 fL (ref 80.0–100.0)
Platelets: 263 K/uL (ref 150–400)
RBC: 4.94 MIL/uL (ref 4.22–5.81)
RDW: 14 % (ref 11.5–15.5)
WBC: 7.5 K/uL (ref 4.0–10.5)
nRBC: 0 % (ref 0.0–0.2)

## 2024-08-13 LAB — TROPONIN T, HIGH SENSITIVITY: Troponin T High Sensitivity: 17 ng/L (ref 0–19)

## 2024-08-13 MED ORDER — LISINOPRIL 20 MG PO TABS: 20.0000 mg | ORAL_TABLET | Freq: Every day | ORAL | 0 refills | Status: AC

## 2024-08-13 MED ORDER — AMLODIPINE BESYLATE 10 MG PO TABS: 10.0000 mg | ORAL_TABLET | Freq: Every day | ORAL | 0 refills | Status: AC

## 2024-08-13 NOTE — Telephone Encounter (Signed)
 FYI Only or Action Required?: FYI only for provider: appointment scheduled on 09/18/2024.   Called Nurse Triage reporting Back Pain.  Symptoms began several weeks ago.  Interventions attempted: Nothing.  Symptoms are: unchanged.  Triage Disposition: See PCP Within 2 Weeks  Patient/caregiver understands and will follow disposition?: Yes  Copied from CRM [id number]. Topic: Clinical - Red Word Triage >> Aug 13, 2024 12:24 PM Winona R wrote: Back pain that has been getting worst over the last two weeks. Pt has Kidney problems  Would like to schedule a new pt appointment ment at [facility] Reason for Disposition  Back pain present > 2 weeks  Answer Assessment - Initial Assessment Questions Patient wants first available Friday appointment with Dr not NP or PA, within certain distance. Booked the soonest appointment that fit the criteria. 1. ONSET: When did the pain begin? (e.g., minutes, hours, days)     2 weeks ago 2. LOCATION: Where does it hurt? (upper, mid or lower back)     Lower back, both sides 3. SEVERITY: How bad is the pain?  (e.g., Scale 1-10; mild, moderate, or severe)     2 4. PATTERN: Is the pain constant? (e.g., yes, no; constant, intermittent)      constant 5. RADIATION: Does the pain shoot into your legs or somewhere else?     Denies 6. CAUSE:  What do you think is causing the back pain?      Has had kidney issues before that caused this pain 7. BACK OVERUSE:  Any recent lifting of heavy objects, strenuous work or exercise?     Denies 8. MEDICINES: What have you taken so far for the pain? (e.g., nothing, acetaminophen , NSAIDS)     Denies 9. NEUROLOGIC SYMPTOMS: Do you have any weakness, numbness, or problems with bowel/bladder control?     Denies 10. OTHER SYMPTOMS: Do you have any other symptoms? (e.g., fever, abdomen pain, burning with urination, blood in urine)       Dark urine  Protocols used: Back Pain-A-AH

## 2024-08-13 NOTE — ED Triage Notes (Signed)
 Left upper chest spasm X 1 day. No Hx or injury.

## 2024-08-13 NOTE — Discharge Instructions (Signed)
 You were seen in the emergency department for your chest muscle twitching.  Your workup showed no signs of heart attack or stress on your heart or abnormalities with your lungs.  Your kidney function was slightly worse from her baseline which may be due to chronic kidney disease or you could be slightly dehydrated causing some muscle spasms.  You should make sure that you are drinking plenty of fluids and staying well-hydrated and I have given you a refill of your blood pressure medication to make sure that your blood pressure is under control.  You should call the primary care office to get established and to follow-up to have your kidney function and symptoms rechecked to determine if you have chronic kidney disease.  You should return to the emergency department if you are having severe chest pain or shortness of breath or any other new or concerning symptoms.

## 2024-08-13 NOTE — ED Provider Notes (Signed)
 " [place] [facility] Provider Note   CSN: [id number] Arrival date & time: 08/13/24  9573     Patient presents with: Spasms   Jaime Brown is a 54 y.o. male.   Patient is a 54 year old male with a past medical history of hypertension and tobacco use presenting to the emergency department with chest spasms.  The patient states since he got home from work last night around 5 PM he has been feeling like his muscles are twitching in the left side of his chest.  He states that he tried to go to sleep last night but woke up this morning and it is still happening.  He denies any associated shortness of breath, fever, cough, lower extremity swelling.  He denies any recent heavy lifting but states he does do repetitive pivoting at work.  The history is provided by the patient.       Prior to Admission medications  Medication Sig Start Date End Date Taking? Authorizing Provider  amLODipine  (NORVASC ) 10 MG tablet Take 1 tablet (10 mg total) by mouth daily. 08/13/24   Kingsley, Cherell Colvin K, DO  erythromycin  ophthalmic ointment Place a 1/2 inch ribbon of ointment into the lower eyelid. 04/19/23   Victor Lynwood DASEN, PA-C  lidocaine  (LIDODERM ) 5 % Place 1 patch onto the skin daily. Remove & Discard patch within 12 hours or as directed by MD 07/05/21   Redwine, Madison A, PA-C  lisinopril  (ZESTRIL ) 20 MG tablet Take 1 tablet (20 mg total) by mouth daily. 08/13/24   Kingsley, Areta Terwilliger K, DO  methocarbamol  (ROBAXIN ) 500 MG tablet Take 1 tablet (500 mg total) by mouth 2 (two) times daily. 07/05/21   Redwine, Madison A, PA-C  naproxen  (NAPROSYN ) 500 MG tablet Take 1 tablet (500 mg total) by mouth 2 (two) times daily. 07/05/21   Redwine, Madison A, PA-C  Vitamin D , Ergocalciferol , (DRISDOL ) 1.25 MG (50000 UNIT) CAPS capsule Take 1 capsule (50,000 Units total) by mouth every 7 (seven) days. 01/11/21   Mayers, Cari S, PA-C    Allergies: Patient has no known allergies.     Review of Systems  Updated Vital Signs BP (!) 164/86   Pulse 60   Temp 98.2 F (36.8 C) (Oral)   Resp 18   Ht 5' 10 (1.778 m)   Wt 68 kg   SpO2 99%   BMI 21.52 kg/m   Physical Exam Vitals and nursing note reviewed.  Constitutional:      General: He is not in acute distress.    Appearance: Normal appearance.  HENT:     Head: Normocephalic and atraumatic.     Nose: Nose normal.     Mouth/Throat:     Mouth: Mucous membranes are moist.  Eyes:     Extraocular Movements: Extraocular movements intact.     Conjunctiva/sclera: Conjunctivae normal.  Cardiovascular:     Rate and Rhythm: Normal rate and regular rhythm.     Heart sounds: Normal heart sounds.     Comments: No chest wall tenderness to palpation Pulmonary:     Effort: Pulmonary effort is normal.     Breath sounds: Normal breath sounds.  Abdominal:     General: Abdomen is flat.     Palpations: Abdomen is soft.     Tenderness: There is no abdominal tenderness.  Musculoskeletal:        General: Normal range of motion.     Cervical back: Normal range of motion.     Right lower  leg: No edema.     Left lower leg: No edema.  Skin:    General: Skin is warm and dry.  Neurological:     General: No focal deficit present.     Mental Status: He is alert and oriented to person, place, and time.  Psychiatric:        Mood and Affect: Mood normal.        Behavior: Behavior normal.     (all labs ordered are listed, but only abnormal results are displayed) Labs Reviewed  BASIC METABOLIC PANEL WITH GFR - Abnormal; Notable for the following components:      Result Value   Glucose, Bld 103 (*)    BUN 28 (*)    Creatinine, Ser 1.87 (*)    GFR, Estimated 43 (*)    All other components within normal limits  CBC  TROPONIN T, HIGH SENSITIVITY    EKG: EKG Interpretation Date/Time:  Thursday August 13 2024 04:39:27 EST Ventricular Rate:  93 PR Interval:  170 QRS Duration:  97 QT Interval:  361 QTC  Calculation: 449 R Axis:   59  Text Interpretation: Sinus rhythm Left ventricular hypertrophy ST elev, probable normal early repol pattern Confirmed by Griselda Norris [phone]) on 08/13/2024 4:44:55 AM  Radiology: [facility] Chest 2 View Result Date: 08/13/2024 CLINICAL DATA:  Chest pain EXAM: CHEST - 2 VIEW COMPARISON:  09/23/2020 FINDINGS: The lungs are clear without focal pneumonia, edema, pneumothorax or pleural effusion. The cardiopericardial silhouette is within normal limits for size. No acute bony abnormality. IMPRESSION: No active cardiopulmonary disease. Electronically Signed   By: Camellia Candle M.D.   On: 08/13/2024 05:32     Procedures   Medications Ordered in the ED - No data to display                                  Medical Decision Making This patient presents to the ED with chief complaint(s) of chest pain with pertinent past medical history of HTN, tobacco use which further complicates the presenting complaint. The complaint involves an extensive differential diagnosis and also carries with it a high risk of complications and morbidity.    The differential diagnosis includes ACS, arrhythmia, anemia, pneumonia, pneumothorax, pulmonary edema, pleural effusion, MSK pain  Additional history obtained: Additional history obtained from N/A Records reviewed Care Everywhere/External Records  ED Course and Reassessment: On patient's arrival he is mildly hypertensive and otherwise hemodynamically stable in no acute distress.  He EKG, labs and chest x-ray initiated in triage.  EKG showed normal sinus rhythm without acute ischemic changes and initial troponin was negative.  Symptoms ongoing since 5 PM last night so single troponin is sufficient.  Chest x-ray showed no acute disease.  His labs showed slightly worsening creatinine compared to his baseline 3 years ago.  He may have some mild dehydration contributing to some muscle twitching/spasms versus worsening chronic kidney disease.  Patient  requested refill of his blood pressure medications and requested a primary care doctor to follow-up with.  He was given strict return precautions.  Independent labs interpretation:  The following labs were independently interpreted: Mildly worsening creatinine from baseline otherwise within normal range  Independent visualization of imaging: - I independently visualized the following imaging with scope of interpretation limited to determining acute life threatening conditions related to emergency care: Chest x-ray, which revealed no acute disease  Consultation: - Consulted or discussed management/test interpretation w/ external  professional: N/A  Consideration for admission or further workup: Patient has no emergent conditions requiring admission or further work-up at this time and is stable for discharge home with primary care follow-up  Social Determinants of health: No PCP    Amount and/or Complexity of Data Reviewed Labs: ordered. Radiology: ordered.  Risk Prescription drug management.       Final diagnoses:  Muscle twitching  Elevated serum creatinine    ED Discharge Orders          Ordered    lisinopril  (ZESTRIL ) 20 MG tablet  Daily        08/13/24 0744    amLODipine  (NORVASC ) 10 MG tablet  Daily        08/13/24 0744               Kingsley, Joseph Bias K, DO 08/13/24 0751  "

## 2024-09-18 ENCOUNTER — Ambulatory Visit: Admitting: Family Medicine
